# Patient Record
Sex: Female | Born: 1985 | Hispanic: No | Marital: Single | Smoking: Never smoker
Health system: Southern US, Community
[De-identification: ages and names within clinical notes are randomized; demographics above are authoritative.]

## PROBLEM LIST (undated history)

## (undated) DIAGNOSIS — N912 Amenorrhea, unspecified: Secondary | ICD-10-CM

## (undated) DIAGNOSIS — N879 Dysplasia of cervix uteri, unspecified: Secondary | ICD-10-CM

## (undated) DIAGNOSIS — N809 Endometriosis, unspecified: Secondary | ICD-10-CM

## (undated) DIAGNOSIS — A6000 Herpesviral infection of urogenital system, unspecified: Secondary | ICD-10-CM

## (undated) DIAGNOSIS — R87619 Unspecified abnormal cytological findings in specimens from cervix uteri: Secondary | ICD-10-CM

## (undated) HISTORY — PX: CERVICAL BIOPSY  W/ LOOP ELECTRODE EXCISION: SUR135

## (undated) HISTORY — DX: Dysplasia of cervix uteri, unspecified: N87.9

## (undated) HISTORY — DX: Endometriosis, unspecified: N80.9

## (undated) HISTORY — PX: LEEP: SHX91

## (undated) HISTORY — DX: Herpesviral infection of urogenital system, unspecified: A60.00

## (undated) HISTORY — DX: Amenorrhea, unspecified: N91.2

## (undated) HISTORY — DX: Unspecified abnormal cytological findings in specimens from cervix uteri: R87.619

---
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
## 2007-01-04 ENCOUNTER — Inpatient Hospital Stay: Payer: Self-pay | Admitting: Internal Medicine

## 2007-06-01 ENCOUNTER — Emergency Department: Payer: Self-pay | Admitting: Emergency Medicine

## 2007-09-08 ENCOUNTER — Observation Stay: Payer: Self-pay | Admitting: Obstetrics and Gynecology

## 2007-09-28 ENCOUNTER — Observation Stay: Payer: Self-pay | Admitting: Obstetrics and Gynecology

## 2007-09-30 ENCOUNTER — Observation Stay: Payer: Self-pay

## 2007-10-02 ENCOUNTER — Observation Stay: Payer: Self-pay | Admitting: Certified Nurse Midwife

## 2007-10-05 ENCOUNTER — Inpatient Hospital Stay: Payer: Self-pay

## 2008-11-18 ENCOUNTER — Emergency Department: Payer: Self-pay | Admitting: Emergency Medicine

## 2008-11-23 ENCOUNTER — Emergency Department: Payer: Self-pay | Admitting: Emergency Medicine

## 2008-11-27 ENCOUNTER — Emergency Department: Payer: Self-pay | Admitting: Unknown Physician Specialty

## 2010-04-22 ENCOUNTER — Emergency Department: Payer: Self-pay | Admitting: Unknown Physician Specialty

## 2010-06-12 ENCOUNTER — Emergency Department: Payer: Self-pay | Admitting: Emergency Medicine

## 2010-10-25 ENCOUNTER — Emergency Department: Payer: Self-pay | Admitting: Emergency Medicine

## 2010-11-06 ENCOUNTER — Emergency Department: Payer: Self-pay | Admitting: Internal Medicine

## 2012-09-14 ENCOUNTER — Emergency Department: Payer: Self-pay | Admitting: Emergency Medicine

## 2012-09-14 LAB — MONONUCLEOSIS SCREEN: Mono Test: NEGATIVE

## 2012-09-16 LAB — BETA STREP CULTURE(ARMC)

## 2014-02-06 ENCOUNTER — Emergency Department: Payer: Self-pay | Admitting: Emergency Medicine

## 2014-02-06 LAB — BASIC METABOLIC PANEL
Anion Gap: 6 — ABNORMAL LOW (ref 7–16)
BUN: 14 mg/dL (ref 7–18)
CALCIUM: 8.5 mg/dL (ref 8.5–10.1)
CHLORIDE: 104 mmol/L (ref 98–107)
Co2: 27 mmol/L (ref 21–32)
Creatinine: 0.9 mg/dL (ref 0.60–1.30)
EGFR (Non-African Amer.): >60
GLUCOSE: 95 mg/dL (ref 65–99)
Osmolality: 274 (ref 275–301)
Potassium: 3.8 mmol/L (ref 3.5–5.1)
SODIUM: 137 mmol/L (ref 136–145)

## 2014-02-06 LAB — URINALYSIS, COMPLETE
Bacteria: NONE SEEN
Bilirubin,UR: NEGATIVE
Blood: NEGATIVE
Glucose,UR: NEGATIVE mg/dL (ref 0–75)
KETONE: NEGATIVE
Leukocyte Esterase: NEGATIVE
Nitrite: NEGATIVE
Ph: 5 (ref 4.5–8.0)
Protein: NEGATIVE
RBC,UR: 1 /HPF (ref 0–5)
SPECIFIC GRAVITY: 1.031 (ref 1.003–1.030)
Squamous Epithelial: 4
WBC UR: 1 /HPF (ref 0–5)

## 2014-02-06 LAB — CBC WITH DIFFERENTIAL/PLATELET
BASOS ABS: 0 10*3/uL (ref 0.0–0.1)
Basophil %: 0.3 %
EOS ABS: 0 10*3/uL (ref 0.0–0.7)
Eosinophil %: 0.5 %
HCT: 41.7 % (ref 35.0–47.0)
HGB: 13.5 g/dL (ref 12.0–16.0)
LYMPHS ABS: 1.8 10*3/uL (ref 1.0–3.6)
Lymphocyte %: 22.6 %
MCH: 27.6 pg (ref 26.0–34.0)
MCHC: 32.3 g/dL (ref 32.0–36.0)
MCV: 85 fL (ref 80–100)
Monocyte #: 0.5 x10 3/mm (ref 0.2–0.9)
Monocyte %: 6.3 %
NEUTROS ABS: 5.6 10*3/uL (ref 1.4–6.5)
Neutrophil %: 70.3 %
Platelet: 224 10*3/uL (ref 150–440)
RBC: 4.88 10*6/uL (ref 3.80–5.20)
RDW: 13.4 % (ref 11.5–14.5)
WBC: 7.9 10*3/uL (ref 3.6–11.0)

## 2014-04-18 ENCOUNTER — Emergency Department: Payer: Self-pay | Admitting: Emergency Medicine

## 2014-04-18 LAB — BASIC METABOLIC PANEL
ANION GAP: 7 (ref 7–16)
BUN: 9 mg/dL (ref 7–18)
CALCIUM: 8.7 mg/dL (ref 8.5–10.1)
Chloride: 104 mmol/L (ref 98–107)
Co2: 28 mmol/L (ref 21–32)
Creatinine: 0.81 mg/dL (ref 0.60–1.30)
Glucose: 87 mg/dL (ref 65–99)
OSMOLALITY: 276 (ref 275–301)
Potassium: 3.6 mmol/L (ref 3.5–5.1)
SODIUM: 139 mmol/L (ref 136–145)

## 2014-04-18 LAB — CBC
HCT: 40.1 % (ref 35.0–47.0)
HGB: 13.1 g/dL (ref 12.0–16.0)
MCH: 28.2 pg (ref 26.0–34.0)
MCHC: 32.6 g/dL (ref 32.0–36.0)
MCV: 87 fL (ref 80–100)
Platelet: 233 10*3/uL (ref 150–440)
RBC: 4.63 10*6/uL (ref 3.80–5.20)
RDW: 12.8 % (ref 11.5–14.5)
WBC: 8.3 10*3/uL (ref 3.6–11.0)

## 2014-04-18 LAB — URINALYSIS, COMPLETE
Bacteria: NONE SEEN
Bilirubin,UR: NEGATIVE
Blood: NEGATIVE
GLUCOSE, UR: NEGATIVE mg/dL (ref 0–75)
KETONE: NEGATIVE
Leukocyte Esterase: NEGATIVE
Nitrite: NEGATIVE
PROTEIN: NEGATIVE
Ph: 6 (ref 4.5–8.0)
RBC,UR: 1 /HPF (ref 0–5)
SPECIFIC GRAVITY: 1.011 (ref 1.003–1.030)
WBC UR: 1 /HPF (ref 0–5)

## 2014-04-18 LAB — HCG, QUANTITATIVE, PREGNANCY: Beta Hcg, Quant.: 7877 m[IU]/mL — ABNORMAL HIGH

## 2015-04-01 ENCOUNTER — Encounter: Payer: Self-pay | Admitting: Urgent Care

## 2015-04-01 ENCOUNTER — Emergency Department
Admission: EM | Admit: 2015-04-01 | Discharge: 2015-04-01 | Disposition: A | Discharge status: Home / Self Care | Payer: BLUE CROSS/BLUE SHIELD | Attending: Emergency Medicine | Admitting: Emergency Medicine

## 2015-04-01 DIAGNOSIS — R1032 Left lower quadrant pain: Secondary | ICD-10-CM | POA: Diagnosis not present

## 2015-04-01 DIAGNOSIS — Z3202 Encounter for pregnancy test, result negative: Secondary | ICD-10-CM | POA: Diagnosis not present

## 2015-04-01 DIAGNOSIS — R197 Diarrhea, unspecified: Secondary | ICD-10-CM | POA: Insufficient documentation

## 2015-04-01 DIAGNOSIS — R110 Nausea: Secondary | ICD-10-CM | POA: Diagnosis not present

## 2015-04-01 DIAGNOSIS — R109 Unspecified abdominal pain: Secondary | ICD-10-CM | POA: Diagnosis present

## 2015-04-01 DIAGNOSIS — R140 Abdominal distension (gaseous): Secondary | ICD-10-CM | POA: Insufficient documentation

## 2015-04-01 DIAGNOSIS — R1084 Generalized abdominal pain: Secondary | ICD-10-CM | POA: Diagnosis not present

## 2015-04-01 LAB — CBC
HEMATOCRIT: 41.1 % (ref 35.0–47.0)
Hemoglobin: 13.2 g/dL (ref 12.0–16.0)
MCH: 26.5 pg (ref 26.0–34.0)
MCHC: 32 g/dL (ref 32.0–36.0)
MCV: 82.7 fL (ref 80.0–100.0)
PLATELETS: 230 10*3/uL (ref 150–440)
RBC: 4.97 MIL/uL (ref 3.80–5.20)
RDW: 13.4 % (ref 11.5–14.5)
WBC: 8.3 10*3/uL (ref 3.6–11.0)

## 2015-04-01 LAB — COMPREHENSIVE METABOLIC PANEL
ALT: 19 U/L (ref 14–54)
AST: 21 U/L (ref 15–41)
Albumin: 5.1 g/dL — ABNORMAL HIGH (ref 3.5–5.0)
Alkaline Phosphatase: 45 U/L (ref 38–126)
Anion gap: 8 (ref 5–15)
BUN: 15 mg/dL (ref 6–20)
CHLORIDE: 106 mmol/L (ref 101–111)
CO2: 24 mmol/L (ref 22–32)
CREATININE: 0.82 mg/dL (ref 0.44–1.00)
Calcium: 9.7 mg/dL (ref 8.9–10.3)
Glucose, Bld: 117 mg/dL — ABNORMAL HIGH (ref 65–99)
POTASSIUM: 3.4 mmol/L — AB (ref 3.5–5.1)
SODIUM: 138 mmol/L (ref 135–145)
Total Bilirubin: 0.4 mg/dL (ref 0.3–1.2)
Total Protein: 8.5 g/dL — ABNORMAL HIGH (ref 6.5–8.1)

## 2015-04-01 LAB — URINALYSIS COMPLETE WITH MICROSCOPIC (ARMC ONLY)
BACTERIA UA: NONE SEEN
Bilirubin Urine: NEGATIVE
Glucose, UA: NEGATIVE mg/dL
HGB URINE DIPSTICK: NEGATIVE
LEUKOCYTES UA: NEGATIVE
Nitrite: NEGATIVE
PH: 5 (ref 5.0–8.0)
PROTEIN: NEGATIVE mg/dL
SPECIFIC GRAVITY, URINE: 1.029 (ref 1.005–1.030)

## 2015-04-01 LAB — POCT PREGNANCY, URINE: PREG TEST UR: NEGATIVE

## 2015-04-01 LAB — LIPASE, BLOOD: LIPASE: 42 U/L (ref 11–51)

## 2015-04-01 MED ORDER — GI COCKTAIL ~~LOC~~
30.0000 mL | Freq: Once | ORAL | Status: AC
  Administered 2015-04-01: 30 mL via ORAL
  Filled 2015-04-01: qty 30

## 2015-04-01 MED ORDER — DICYCLOMINE HCL 10 MG PO CAPS: 10.0000 mg | ORAL_CAPSULE | Freq: Three times a day (TID) | ORAL | Status: DC

## 2015-04-01 MED ORDER — CIPROFLOXACIN HCL 500 MG PO TABS: 500.0000 mg | ORAL_TABLET | Freq: Two times a day (BID) | ORAL | Status: AC

## 2015-04-01 NOTE — ED Notes (Signed)
MD at bedside. 

## 2015-04-01 NOTE — ED Provider Notes (Signed)
[facility] Emergency Department Provider Note  ____________________________________________  Time seen: Approximately 413 AM  I have reviewed the triage vital signs and the nursing notes.   HISTORY  Chief Complaint Abdominal Pain    HPI Genice RougeMegan E Dicenzo is a 29 y.o. female who comes into the hospital today with sharp abdominal pain and nausea. The patient reports that she feels as though she needs to vomit. She reports that the pain occurs out of nowhere and goes away on its own. She reports that the symptoms started last week. She reports that she took some aspirin and within 45 minutes the symptoms came back. Currently the patient has eased off and it is a 3 out of 10 in intensity. The patient feels that her stomach is bloated and her entire stomach hurts. She is also been having some diarrhea this been frequent small quantities. The patient has not been having any fever denies any sick contacts. She reports that she just feels really crampy and uncomfortable. She was unsure what to do is going on so she decided to come into the hospital for evaluation.   History reviewed. No pertinent past medical history.  There are no active problems to display for this patient.   Past Surgical History  Procedure Laterality Date  . Leep      Current Outpatient Rx  Name  Route  Sig  Dispense  Refill  . ciprofloxacin (CIPRO) 500 MG tablet   Oral   Take 1 tablet (500 mg total) by mouth 2 (two) times daily.   10 tablet   0   . dicyclomine (BENTYL) 10 MG capsule   Oral   Take 1 capsule (10 mg total) by mouth 3 (three) times daily before meals.   15 capsule   0     Allergies Review of patient's allergies indicates no known allergies.  No family history on file.  Social History Social History  Substance Use Topics  . Smoking status: Never Smoker   . Smokeless tobacco: None  . Alcohol Use: Yes    Review of Systems Constitutional: No fever/chills Eyes: No  visual changes. ENT: No sore throat. Cardiovascular: Denies chest pain. Respiratory: Denies shortness of breath. Gastrointestinal:  abdominal pain.  Nausea and diarrhea Genitourinary: Negative for dysuria. Musculoskeletal: Negative for back pain. Skin: Negative for rash. Neurological: Negative for headaches, focal weakness or numbness.  10-point ROS otherwise negative.  ____________________________________________   PHYSICAL EXAM:  VITAL SIGNS: ED Triage Vitals  Enc Vitals Group     BP 04/01/15 0040 134/89 mmHg     Pulse Rate 04/01/15 0040 97     Resp 04/01/15 0040 16     Temp 04/01/15 0040 98.4 F (36.9 C)     Temp Source 04/01/15 0040 Oral     SpO2 04/01/15 0040 100 %     Weight 04/01/15 0040 170 lb (77.111 kg)     Height 04/01/15 0040 5\' 11"  (1.803 m)     Head Cir --      Peak Flow --      Pain Score 04/01/15 0040 7     Pain Loc --      Pain Edu? --      Excl. in GC? --     Constitutional: Alert and oriented. Well appearing and in mild distress. Eyes: Conjunctivae are normal. PERRL. EOMI. Head: Atraumatic. Nose: No congestion/rhinnorhea. Mouth/Throat: Mucous membranes are moist.  Oropharynx non-erythematous. Cardiovascular: Normal rate, regular rhythm. Grossly normal heart sounds.  Good peripheral  circulation. Respiratory: Normal respiratory effort.  No retractions. Lungs CTAB. Gastrointestinal: Soft with mild diffuse tenderness to palpation worse in the left lower quadrant. No distention. Increased bowel sounds Musculoskeletal: No lower extremity tenderness nor edema.   Neurologic:  Normal speech and language.  Skin:  Skin is warm, dry and intact.  Psychiatric: Mood and affect are normal.   ____________________________________________   LABS (all labs ordered are listed, but only abnormal results are displayed)  Labs Reviewed  COMPREHENSIVE METABOLIC PANEL - Abnormal; Notable for the following:    Potassium 3.4 (*)    Glucose, Bld 117 (*)    Total  Protein 8.5 (*)    Albumin 5.1 (*)    All other components within normal limits  URINALYSIS COMPLETEWITH MICROSCOPIC ([facility] ONLY) - Abnormal; Notable for the following:    Color, Urine YELLOW (*)    APPearance HAZY (*)    Ketones, ur TRACE (*)    Squamous Epithelial / LPF 6-30 (*)    All other components within normal limits  LIPASE, BLOOD  CBC  POC URINE PREG, ED  POCT PREGNANCY, URINE   ____________________________________________  EKG  None ____________________________________________  RADIOLOGY  None ____________________________________________   PROCEDURES  Procedure(s) performed: None  Critical Care performed: No  ____________________________________________   INITIAL IMPRESSION / ASSESSMENT AND PLAN / ED COURSE  Pertinent labs & imaging results that were available during my care of the patient were reviewed by me and considered in my medical decision making (see chart for details).  This is a 29 year old female who comes into the hospital today with diffuse abdominal pain that comes and goes. She reports that she's been having diarrhea and the pain seems to be worse in her left lower quadrant. Patient has been having diarrhea and has some increased hyperactive bowel sounds. I did give the patient and GI cocktail initially but she reports that that made her pain little bit worse. I feel that the patient is having some cramping from hyperactive bowels and could use an antispasmodic such as Bentyl which will help her symptoms. Also given the diarrhea I feel the patient can be placed on a short course of ciprofloxacin which may help her symptoms as well. I discussed this with the patient and informed her that she should also follow up with the acute care clinic to determine if the medications are helping her symptoms. The patient does not have an abnormal white blood cell count nor does she have any abnormal electrolytes. The patient be discharged home to follow-up. The  patient understands and agrees with the plan as stated. ____________________________________________   FINAL CLINICAL IMPRESSION(S) / ED DIAGNOSES  Final diagnoses:  Diarrhea of presumed infectious origin  Generalized abdominal pain      Rebecka Apley, MD 04/01/15 [phone]

## 2015-04-01 NOTE — ED Notes (Signed)
Patient presents with c/o acute onset diffuse abd pain that began ONE hour PTA. Bloating sensation reported - reports frequent BC powder use. (+) nausea reported. Denies urinary symptoms.

## 2015-04-01 NOTE — Discharge Instructions (Signed)
Abdominal Pain, Adult Many things can cause abdominal pain. Usually, abdominal pain is not caused by a disease and will improve without treatment. It can often be observed and treated at home. Your health care provider will do a physical exam and possibly order blood tests and X-rays to help determine the seriousness of your pain. However, in many cases, more time must pass before a clear cause of the pain can be found. Before that point, your health care provider may not know if you need more testing or further treatment. HOME CARE INSTRUCTIONS Monitor your abdominal pain for any changes. The following actions may help to alleviate any discomfort you are experiencing:  Only take over-the-counter or prescription medicines as directed by your health care provider.  Do not take laxatives unless directed to do so by your health care provider.  Try a clear liquid diet (broth, tea, or water) as directed by your health care provider. Slowly move to a bland diet as tolerated. SEEK MEDICAL CARE IF:  You have unexplained abdominal pain.  You have abdominal pain associated with nausea or diarrhea.  You have pain when you urinate or have a bowel movement.  You experience abdominal pain that wakes you in the night.  You have abdominal pain that is worsened or improved by eating food.  You have abdominal pain that is worsened with eating fatty foods.  You have a fever. SEEK IMMEDIATE MEDICAL CARE IF:  Your pain does not go away within 2 hours.  You keep throwing up (vomiting).  Your pain is felt only in portions of the abdomen, such as the right side or the left lower portion of the abdomen.  You pass bloody or black tarry stools. MAKE SURE YOU:  Understand these instructions.  Will watch your condition.  Will get help right away if you are not doing well or get worse.   This information is not intended to replace advice given to you by your health care provider. Make sure you discuss  any questions you have with your health care provider.   Document Released: 01/06/2005 Document Revised: 12/18/2014 Document Reviewed: 12/06/2012 Elsevier Interactive Patient Education 2016 [place].  Diarrhea Diarrhea is frequent loose and watery bowel movements. It can cause you to feel weak and dehydrated. Dehydration can cause you to become tired and thirsty, have a dry mouth, and have decreased urination that often is dark yellow. Diarrhea is a sign of another problem, most often an infection that will not last long. In most cases, diarrhea typically lasts 2-3 days. However, it can last longer if it is a sign of something more serious. It is important to treat your diarrhea as directed by your caregiver to lessen or prevent future episodes of diarrhea. CAUSES  Some common causes include:  Gastrointestinal infections caused by viruses, bacteria, or parasites.  Food poisoning or food allergies.  Certain medicines, such as antibiotics, chemotherapy, and laxatives.  Artificial sweeteners and fructose.  Digestive disorders. HOME CARE INSTRUCTIONS  Ensure adequate fluid intake (hydration): Have 1 cup (8 oz) of fluid for each diarrhea episode. Avoid fluids that contain simple sugars or sports drinks, fruit juices, whole milk products, and sodas. Your urine should be clear or pale yellow if you are drinking enough fluids. Hydrate with an oral rehydration solution that you can purchase at pharmacies, retail stores, and online. You can prepare an oral rehydration solution at home by mixing the following ingredients together:   - tsp table salt.   tsp baking soda.  tsp salt substitute containing potassium chloride.  1  tablespoons sugar.  1 L (34 oz) of water.  Certain foods and beverages may increase the speed at which food moves through the gastrointestinal (GI) tract. These foods and beverages should be avoided and include:  Caffeinated and alcoholic beverages.  High-fiber  foods, such as raw fruits and vegetables, nuts, seeds, and whole grain breads and cereals.  Foods and beverages sweetened with sugar alcohols, such as xylitol, sorbitol, and mannitol.  Some foods may be well tolerated and may help thicken stool including:  Starchy foods, such as rice, toast, pasta, low-sugar cereal, oatmeal, grits, baked potatoes, crackers, and bagels.  Bananas.  Applesauce.  Add probiotic-rich foods to help increase healthy bacteria in the GI tract, such as yogurt and fermented milk products.  Wash your hands well after each diarrhea episode.  Only take over-the-counter or prescription medicines as directed by your caregiver.  Take a warm bath to relieve any burning or pain from frequent diarrhea episodes. SEEK IMMEDIATE MEDICAL CARE IF:   You are unable to keep fluids down.  You have persistent vomiting.  You have blood in your stool, or your stools are black and tarry.  You do not urinate in 6-8 hours, or there is only a small amount of very dark urine.  You have abdominal pain that increases or localizes.  You have weakness, dizziness, confusion, or light-headedness.  You have a severe headache.  Your diarrhea gets worse or does not get better.  You have a fever or persistent symptoms for more than 2-3 days.  You have a fever and your symptoms suddenly get worse. MAKE SURE YOU:   Understand these instructions.  Will watch your condition.  Will get help right away if you are not doing well or get worse.   This information is not intended to replace advice given to you by your health care provider. Make sure you discuss any questions you have with your health care provider.   Document Released: 03/19/2002 Document Revised: 04/19/2014 Document Reviewed: 12/05/2011 Elsevier Interactive Patient Education 2016 Elsevier Inc.  Colitis Colitis is inflammation of the colon. Colitis may last a short time (acute) or it may last a long time  (chronic). CAUSES This condition may be caused by:  Viruses.  Bacteria.  Reactions to medicine.  Certain autoimmune diseases, such as Crohn disease or ulcerative colitis. SYMPTOMS Symptoms of this condition include:  Diarrhea.  Passing bloody or tarry stool.  Pain.  Fever.  Vomiting.  Tiredness (fatigue).  Weight loss.  Bloating.  Sudden increase in abdominal pain.  Having fewer bowel movements than usual. DIAGNOSIS This condition is diagnosed with a stool test or a blood test. You may also have other tests, including X-rays, a CT scan, or a colonoscopy. TREATMENT Treatment may include:  Resting the bowel. This involves not eating or drinking for a period of time.  Fluids that are given through an IV tube.  Medicine for pain and diarrhea.  Antibiotic medicines.  Cortisone medicines.  Surgery. HOME CARE INSTRUCTIONS Eating and Drinking  Follow instructions from your health care provider about eating or drinking restrictions.  Drink enough fluid to keep your urine clear or pale yellow.  Work with a dietitian to determine which foods cause your condition to flare up.  Avoid foods that cause flare-ups.  Eat a well-balanced diet. Medicines  Take over-the-counter and prescription medicines only as told by your health care provider.  If you were prescribed an antibiotic medicine, take it  as told by your health care provider. Do not stop taking the antibiotic even if you start to feel better. General Instructions  Keep all follow-up visits as told by your health care provider. This is important. SEEK MEDICAL CARE IF:  Your symptoms do not go away.  You develop new symptoms. SEEK IMMEDIATE MEDICAL CARE IF:  You have a fever that does not go away with treatment.  You develop chills.  You have extreme weakness, fainting, or dehydration.  You have repeated vomiting.  You develop severe pain in your abdomen.  You pass bloody or tarry stool.    This information is not intended to replace advice given to you by your health care provider. Make sure you discuss any questions you have with your health care provider.   Document Released: 05/06/2004 Document Revised: 12/18/2014 Document Reviewed: 07/22/2014 Elsevier Interactive Patient Education [employer] Elsevier Inc.

## 2016-01-21 ENCOUNTER — Telehealth: Payer: Self-pay | Admitting: Obstetrics and Gynecology

## 2016-01-21 NOTE — Telephone Encounter (Signed)
lmtrc

## 2016-01-21 NOTE — Telephone Encounter (Signed)
Pt is almost [redacted] wk pregnant confirmed at [facility], I schedule her nurse ob intake in 2 wks but she is having pain on her left side, not constant and it's not sharpe, it comes and goes.  The nurse at [facility] told her to go to hospital but she wanted to see what we thought? Can you call her.

## 2016-01-21 NOTE — Telephone Encounter (Signed)
Message that pt had returned call. I spoke with pt and she states that pain of left side is more like a dull ache, not all the time but was just concerned. LMP: 12/12/2015 exact. EGA 5.5 wks. EDD: 09/17/2016. Pt advised that it could possibly be an ovarian cyst of which is not uncommon in pregnancy. Pt also states that her stomach feels "puggy". No pain but feels this may be a little early for that. Has had previous pregnancy of which may have stretch her muscles there.  Pt to keep a check on pain and contact office or go to ER if pain severe and persistent. Otherwise take Tylenol ES and rest as much as possible. (pt is on feet all day at work).

## 2016-01-21 NOTE — Telephone Encounter (Signed)
Pt called and stated that she was returning your call

## 2016-02-02 ENCOUNTER — Encounter: Payer: Self-pay | Admitting: Emergency Medicine

## 2016-02-02 ENCOUNTER — Emergency Department
Admission: EM | Admit: 2016-02-02 | Discharge: 2016-02-02 | Disposition: A | Discharge status: Home / Self Care | Payer: Medicaid Other | Attending: Emergency Medicine | Admitting: Emergency Medicine

## 2016-02-02 ENCOUNTER — Emergency Department: Payer: Medicaid Other

## 2016-02-02 ENCOUNTER — Telehealth: Payer: Self-pay

## 2016-02-02 DIAGNOSIS — R102 Pelvic and perineal pain: Secondary | ICD-10-CM | POA: Insufficient documentation

## 2016-02-02 DIAGNOSIS — O99351 Diseases of the nervous system complicating pregnancy, first trimester: Secondary | ICD-10-CM | POA: Insufficient documentation

## 2016-02-02 DIAGNOSIS — R55 Syncope and collapse: Secondary | ICD-10-CM

## 2016-02-02 DIAGNOSIS — N760 Acute vaginitis: Secondary | ICD-10-CM

## 2016-02-02 DIAGNOSIS — Z79899 Other long term (current) drug therapy: Secondary | ICD-10-CM | POA: Insufficient documentation

## 2016-02-02 DIAGNOSIS — O23591 Infection of other part of genital tract in pregnancy, first trimester: Secondary | ICD-10-CM | POA: Insufficient documentation

## 2016-02-02 DIAGNOSIS — B9689 Other specified bacterial agents as the cause of diseases classified elsewhere: Secondary | ICD-10-CM

## 2016-02-02 DIAGNOSIS — Z3A01 Less than 8 weeks gestation of pregnancy: Secondary | ICD-10-CM | POA: Insufficient documentation

## 2016-02-02 LAB — ABO/RH: ABO/RH(D): B POS

## 2016-02-02 LAB — CHLAMYDIA/NGC RT PCR (ARMC ONLY)
CHLAMYDIA TR: NOT DETECTED
N GONORRHOEAE: NOT DETECTED

## 2016-02-02 LAB — WET PREP, GENITAL
TRICH WET PREP: NONE SEEN
YEAST WET PREP: NONE SEEN

## 2016-02-02 LAB — URINALYSIS COMPLETE WITH MICROSCOPIC (ARMC ONLY)
Bilirubin Urine: NEGATIVE
GLUCOSE, UA: NEGATIVE mg/dL
HGB URINE DIPSTICK: NEGATIVE
KETONES UR: NEGATIVE mg/dL
LEUKOCYTES UA: NEGATIVE
NITRITE: NEGATIVE
Protein, ur: NEGATIVE mg/dL
Specific Gravity, Urine: 1.015 (ref 1.005–1.030)
pH: 5 (ref 5.0–8.0)

## 2016-02-02 LAB — HCG, QUANTITATIVE, PREGNANCY: HCG, BETA CHAIN, QUANT, S: 83288 m[IU]/mL — AB (ref <5)

## 2016-02-02 LAB — BASIC METABOLIC PANEL
ANION GAP: 8 (ref 5–15)
BUN: 8 mg/dL (ref 6–20)
CALCIUM: 9.3 mg/dL (ref 8.9–10.3)
CO2: 24 mmol/L (ref 22–32)
Chloride: 102 mmol/L (ref 101–111)
Creatinine, Ser: 0.67 mg/dL (ref 0.44–1.00)
GFR calc Af Amer: >60 mL/min (ref >60)
Glucose, Bld: 92 mg/dL (ref 65–99)
POTASSIUM: 3.7 mmol/L (ref 3.5–5.1)
SODIUM: 134 mmol/L — AB (ref 135–145)

## 2016-02-02 LAB — CBC
HCT: 37.9 % (ref 35.0–47.0)
HEMOGLOBIN: 12.9 g/dL (ref 12.0–16.0)
MCH: 28.3 pg (ref 26.0–34.0)
MCHC: 34.2 g/dL (ref 32.0–36.0)
MCV: 82.8 fL (ref 80.0–100.0)
Platelets: 212 10*3/uL (ref 150–440)
RBC: 4.58 MIL/uL (ref 3.80–5.20)
RDW: 13.2 % (ref 11.5–14.5)
WBC: 7.6 10*3/uL (ref 3.6–11.0)

## 2016-02-02 MED ORDER — METRONIDAZOLE 500 MG PO TABS: 500.0000 mg | ORAL_TABLET | Freq: Two times a day (BID) | ORAL | 0 refills | Status: DC

## 2016-02-02 MED ORDER — SODIUM CHLORIDE 0.9 % IV BOLUS (SEPSIS)
1000.0000 mL | Freq: Once | INTRAVENOUS | Status: AC
  Administered 2016-02-02: 1000 mL via INTRAVENOUS

## 2016-02-02 NOTE — Telephone Encounter (Signed)
Pt states this am she was feeling dizzy, lite headed, sweaty, and hrt racing. She sat down and had some h20 and felt better. NO n/v/d. NO h/a. NO vb or pp. Slight discomfort at times in pelvic area but relived with tylenol. Pt advised to push fluids. At least 4  20oz bottles a day. May want to drink Gatorade as well. Eat every couple of hours. Protein, fruits and veggies. If sx still persist, she will need to make an appt to be seen. Pt voices understanding.

## 2016-02-02 NOTE — ED Notes (Addendum)
Pt stating that while she was at work she became really dizzy and then sat down and broke out in a sweat. Pt stating that she is [redacted] weeks pregnant. Pt had nausea with this episode. Pt denying any vaginal bleeding.

## 2016-02-02 NOTE — ED Provider Notes (Signed)
[facility]-EMERGENCY DEPARTMENT Provider Note   CSN: [id number] Arrival date & time: 02/02/16  1026     History   Chief Complaint Chief Complaint  Patient presents with  . Near Syncope    HPI Angela Fox is a 30 y.o. female here with vaginal bleeding. LMP was beginning of August. She has been Nauseated for several weeks. She had a positive pregnancy [facility] several weeks ago. This morning, she had sudden onset of left lower quadrant pain and almost passed out. She did not have an ultrasound for this baby yet. Denies any vaginal bleeding. States that she had previous pregnancy but did not receive RhoGAM before.   The history is provided by the patient.    History reviewed. No pertinent past medical history.  There are no active problems to display for this patient.   Past Surgical History:  Procedure Laterality Date  . LEEP      OB History    Gravida Para Term Preterm AB Living   1             SAB TAB Ectopic Multiple Live Births                   Home Medications    Prior to Admission medications   Medication Sig Start Date End Date Taking? Authorizing Provider  dicyclomine (BENTYL) 10 MG capsule Take 1 capsule (10 mg total) by mouth 3 (three) times daily before meals. 04/01/15 04/15/15  Rebecka Apley, MD    Family History No family history on file.  Social History Social History  Substance Use Topics  . Smoking status: Never Smoker  . Smokeless tobacco: Never Used  . Alcohol use Yes     Allergies   Review of patient's allergies indicates no known allergies.   Review of Systems Review of Systems  Cardiovascular: Positive for near-syncope.  Gastrointestinal: Positive for abdominal pain.  Neurological: Positive for dizziness.  All other systems reviewed and are negative.    Physical Exam Updated Vital Signs BP 108/77 (BP Location: Right Arm)   Pulse 92   Temp 98.5 F (36.9 C) (Oral)   Resp 16   Ht 5\' 11"  (1.803 m)    Wt 160 lb (72.6 kg)   LMP 11/11/2015   SpO2 100%   BMI 22.32 kg/m   Physical Exam  Constitutional: She is oriented to person, place, and time.  Slightly dehydrated   HENT:  Head: Normocephalic.  MM slightly dry   Eyes: EOM are normal. Pupils are equal, round, and reactive to light.  Neck: Normal range of motion. Neck supple.  Cardiovascular: Normal rate, regular rhythm and normal heart sounds.   Pulmonary/Chest: Effort normal and breath sounds normal. No respiratory distress. She has no wheezes. She has no rales.  Abdominal: Soft. Bowel sounds are normal.  Mild L pelvic tenderness   Genitourinary:  Genitourinary Comments: Whitish, clear discharge. + l adnexal tenderness. Os closed   Musculoskeletal: Normal range of motion.  Neurological: She is alert and oriented to person, place, and time.  Skin: Skin is warm.  Psychiatric: She has a normal mood and affect.  Nursing note and vitals reviewed.    ED Treatments / Results  Labs (all labs ordered are listed, but only abnormal results are displayed) Labs Reviewed  WET PREP, GENITAL - Abnormal; Notable for the following:       Result Value   Clue Cells Wet Prep HPF POC PRESENT (*)    WBC,  Wet Prep HPF POC FEW (*)    All other components within normal limits  BASIC METABOLIC PANEL - Abnormal; Notable for the following:    Sodium 134 (*)    All other components within normal limits  URINALYSIS COMPLETEWITH MICROSCOPIC ([facility] ONLY) - Abnormal; Notable for the following:    Color, Urine YELLOW (*)    APPearance CLEAR (*)    Bacteria, UA RARE (*)    Squamous Epithelial / LPF 0-5 (*)    All other components within normal limits  HCG, QUANTITATIVE, PREGNANCY - Abnormal; Notable for the following:    hCG, Beta Chain, Quant, S 83,288 (*)    All other components within normal limits  CHLAMYDIA/NGC RT PCR ([facility] ONLY)  CBC  CBG MONITORING, ED  ABO/RH    EKG  EKG Interpretation None      ED ECG REPORT I, Richardean Canalavid H  Ulisses Vondrak, the attending physician, personally viewed and interpreted this ECG.   Date: 02/02/2016  EKG Time: 11:16 am  Rate: 75  Rhythm: normal EKG, normal sinus rhythm  Axis: normal  Intervals:none  ST&T Change: none   Radiology No results found.  Procedures Procedures (including critical care time)  Medications Ordered in ED Medications  sodium chloride 0.9 % bolus 1,000 mL (1,000 mLs Intravenous New Bag/Given 02/02/16 1414)     Initial Impression / Assessment and Plan / ED Course  I have reviewed the triage vital signs and the nursing notes.  Pertinent labs & imaging results that were available during my care of the patient were reviewed by me and considered in my medical decision making (see chart for details).  Clinical Course    Angela Fox is a 30 y.o. female here with LLQ pain, near syncope. Concerned for possible ectopic. Will get labs, HCG, Rh, Transvag US.   2:38 PM Patient orthostatic by HR criteria. HCG 83,000. O positive. US pending.   3:30 PM US still pending. Signed out to Dr. Cyril LoosenKinner to follow up US. Wet prep + clue cells, given flagyl. Has OB follow up     Final Clinical Impressions(s) / ED Diagnoses   Final diagnoses:  Pelvic pain    New Prescriptions New Prescriptions   No medications on file     Charlynne Panderavid [facility] [place], MD 02/02/16 1530

## 2016-02-02 NOTE — ED Triage Notes (Addendum)
Felt lightheaded and dizzy this morning while at work.  Denies dizziness at this time.  Also patient states she is [redacted] weeks pregnant and has had discomfort to LLQ for several weeks.  Has appointment with scheduled for Friday for first OB visit.

## 2016-02-02 NOTE — ED Notes (Signed)
Patient transported to Ultrasound 

## 2016-02-02 NOTE — ED Notes (Signed)
Lab called for add on 

## 2016-02-09 ENCOUNTER — Ambulatory Visit (INDEPENDENT_AMBULATORY_CARE_PROVIDER_SITE_OTHER): Payer: Medicaid Other | Admitting: Obstetrics and Gynecology

## 2016-02-09 VITALS — BP 104/70 | HR 74 | Wt 162.8 lb

## 2016-02-09 DIAGNOSIS — Z1389 Encounter for screening for other disorder: Secondary | ICD-10-CM

## 2016-02-09 DIAGNOSIS — Z113 Encounter for screening for infections with a predominantly sexual mode of transmission: Secondary | ICD-10-CM

## 2016-02-09 DIAGNOSIS — Z331 Pregnant state, incidental: Secondary | ICD-10-CM

## 2016-02-09 DIAGNOSIS — Z369 Encounter for antenatal screening, unspecified: Secondary | ICD-10-CM

## 2016-02-09 DIAGNOSIS — Z3481 Encounter for supervision of other normal pregnancy, first trimester: Secondary | ICD-10-CM

## 2016-02-09 NOTE — Patient Instructions (Signed)
Pregnancy and Zika Virus Disease Zika virus disease, or Zika, is an illness that can spread to people from mosquitoes that carry the virus. It may also spread from person to person through infected body fluids. Zika first occurred in [place], but recently it has spread to new areas. The virus occurs in tropical climates. The location of Zika continues to change. Most people who become infected with Zika virus do not develop serious illness. However, Zika may cause birth defects in an unborn baby whose mother is infected with the virus. It may also increase the risk of miscarriage. WHAT ARE THE SYMPTOMS OF ZIKA VIRUS DISEASE? In many cases, people who have been infected with Zika virus do not develop any symptoms. If symptoms appear, they usually start about a week after the person is infected. Symptoms are usually mild. They may include:  Fever.  Rash.  Red eyes.  Joint pain. HOW DOES ZIKA VIRUS DISEASE SPREAD? The main way that Zika virus spreads is through the bite of a certain type of mosquito. Unlike most types of mosquitos, which bite only at night, the type of mosquito that carries Zika virus bites both at night and during the day. Zika virus can also spread through sexual contact, through a blood transfusion, and from a mother to her baby before or during birth. Once you have had Zika virus disease, it is unlikely that you will get it again. CAN I PASS ZIKA TO MY BABY DURING PREGNANCY? Yes, Zika can pass from a mother to her baby before or during birth. WHAT PROBLEMS CAN ZIKA CAUSE FOR MY BABY? A woman who is infected with Zika virus while pregnant is at risk of having her baby born with a condition in which the brain or head is smaller than expected (microcephaly). Babies who have microcephaly can have developmental delays, seizures, hearing problems, and vision problems. Having Zika virus disease during pregnancy can also increase the risk of miscarriage. HOW CAN ZIKA VIRUS DISEASE BE  PREVENTED? There is no vaccine to prevent Zika. The best way to prevent the disease is to avoid infected mosquitoes and avoid exposure to body fluids that can spread the virus. Avoid any possible exposure to Zika by taking the following precautions. For women and their sex partners:  Avoid traveling to high-risk areas. The locations where Zika is being reported change often. To identify high-risk areas, check the CDC travel website: www.cdc.gov/zika/geo/index.html  If you or your sex partner must travel to a high-risk area, talk with a health care provider before and after traveling.  Take all precautions to avoid mosquito bites if you live in, or travel to, any of the high-risk areas. Insect repellents are safe to use during pregnancy.  Ask your health care provider when it is safe to have sexual contact. For women:  If you are pregnant or trying to become pregnant, avoid sexual contact with persons who may have been exposed to Zika virus, persons who have possible symptoms of Zika, or persons whose history you are unsure about. If you choose to have sexual contact with someone who may have been exposed to Zika virus, use condoms correctly during the entire duration of sexual activity, every time. Do not share sexual devices, as you may be exposed to body fluids.  Ask your health care provider about when it is safe to attempt pregnancy after a possible exposure to Zika virus. WHAT STEPS SHOULD I TAKE TO AVOID MOSQUITO BITES? Take these steps to avoid mosquito bites when you are   in a high-risk area:  Wear loose clothing that covers your arms and legs.  Limit your outdoor activities.  Do not open windows unless they have window screens.  Sleep under mosquito nets.  Use insect repellent. The best insect repellents have:  DEET, picaridin, oil of lemon eucalyptus (OLE), or IR3535 in them.  Higher amounts of an active ingredient in them.  Remember that insect repellents are safe to use  during pregnancy.  Do not use OLE on children who are younger than 3 years of age. Do not use insect repellent on babies who are younger than 2 months of age.  Cover your child's stroller with mosquito netting. Make sure the netting fits snugly and that any loose netting does not cover your child's mouth or nose. Do not use a blanket as a mosquito-protection cover.  Do not apply insect repellent underneath clothing.  If you are using sunscreen, apply the sunscreen before applying the insect repellent.  Treat clothing with permethrin. Do not apply permethrin directly to your skin. Follow label directions for safe use.  Get rid of standing water, where mosquitoes may reproduce. Standing water is often found in items such as buckets, bowls, animal food dishes, and flowerpots. When you return from traveling to any high-risk area, continue taking actions to protect yourself against mosquito bites for 3 weeks, even if you show no signs of illness. This will prevent spreading Zika virus to uninfected mosquitoes. WHAT SHOULD I KNOW ABOUT THE SEXUAL TRANSMISSION OF ZIKA? People can spread Zika to their sexual partners during vaginal, anal, or oral sex, or by sharing sexual devices. Many people with Zika do not develop symptoms, so a person could spread the disease without knowing that they are infected. The greatest risk is to women who are pregnant or who may become pregnant. Zika virus can live longer in semen than it can live in blood. Couples can prevent sexual transmission of the virus by:  Using condoms correctly during the entire duration of sexual activity, every time. This includes vaginal, anal, and oral sex.  Not sharing sexual devices. Sharing increases your risk of being exposed to body fluid from another person.  Avoiding all sexual activity until your health care provider says it is safe. SHOULD I BE TESTED FOR ZIKA VIRUS? A sample of your blood can be tested for Zika virus. A pregnant  woman should be tested if she may have been exposed to the virus or if she has symptoms of Zika. She may also have additional tests done during her pregnancy, such ultrasound testing. Talk with your health care provider about which tests are recommended.   This information is not intended to replace advice given to you by your health care provider. Make sure you discuss any questions you have with your health care provider.   Document Released: 12/18/2014 Document Reviewed: 12/11/2014 Elsevier Interactive Patient Education 2016 Elsevier Inc. Minor Illnesses and Medications in Pregnancy  Cold/Flu:  Sudafed for congestion- Robitussin (plain) for cough- Tylenol for discomfort.  Please follow the directions on the label.  Try not to take any more than needed.  OTC Saline nasal spray and air humidifier or cool-mist  Vaporizer to sooth nasal irritation and to loosen congestion.  It is also important to increase intake of non carbonated fluids, especially if you have a fever.  Constipation:  Colace-2 capsules at bedtime; Metamucil- follow directions on label; Senokot- 1 tablet at bedtime.  Any one of these medications can be used.  It is also   very important to increase fluids and fruits along with regular exercise.  If problem persists please call the office.  Diarrhea:  Kaopectate as directed on the label.  Eat a bland diet and increase fluids.  Avoid highly seasoned foods.  Headache:  Tylenol 1 or 2 tablets every 3-4 hours as needed  Indigestion:  Maalox, Mylanta, Tums or Rolaids- as directed on label.  Also try to eat small meals and avoid fatty, greasy or spicy foods.  Nausea with or without Vomiting:  Nausea in pregnancy is caused by increased levels of hormones in the body which influence the digestive system and cause irritation when stomach acids accumulate.  Symptoms usually subside after 1st trimester of pregnancy.  Try the following: 1. Keep saltines, graham crackers or dry toast by your bed  to eat upon awakening. 2. Don't let your stomach get empty.  Try to eat 5-6 small meals per day instead of 3 large ones. 3. Avoid greasy fatty or highly seasoned foods.  4. Take OTC Unisom 1 tablet at bed time along with OTC Vitamin B6 25-50 mg 3 times per day.    If nausea continues with vomiting and you are unable to keep down food and fluids you may need a prescription medication.  Please notify your provider.   Sore throat:  Chloraseptic spray, throat lozenges and or plain Tylenol.  Vaginal Yeast Infection:  OTC Monistat for 7 days as directed on label.  If symptoms do not resolve within a week notify provider.  If any of the above problems do not subside with recommended treatment please call the office for further assistance.   Do not take Aspirin, Advil, Motrin or Ibuprofen.  * * OTC= Over the counter Commonly Asked Questions During Pregnancy  Cats: A parasite can be excreted in cat feces.  To avoid exposure you need to have another person empty the little box.  If you must empty the litter box you will need to wear gloves.  Wash your hands after handling your cat.  This parasite can also be found in raw or undercooked meat so this should also be avoided.  Colds, Sore Throats, Flu: Please check your medication sheet to see what you can take for symptoms.  If your symptoms are unrelieved by these medications please call the office.  Dental Work: Most any dental work Agricultural consultantyour dentist recommends is permitted.  X-rays should only be taken during the first trimester if absolutely necessary.  Your abdomen should be shielded with a lead apron during all x-rays.  Please notify your provider prior to receiving any x-rays.  Novocaine is fine; gas is not recommended.  If your dentist requires a note from us prior to dental work please call the office and we will provide one for you.  Exercise: Exercise is an important part of staying healthy during your pregnancy.  You may continue most exercises you  were accustomed to prior to pregnancy.  Later in your pregnancy you will most likely notice you have difficulty with activities requiring balance like riding a bicycle.  It is important that you listen to your body and avoid activities that put you at a higher risk of falling.  Adequate rest and staying well hydrated are a must!  If you have questions about the safety of specific activities ask your provider.    Exposure to Children with illness: Try to avoid obvious exposure; report any symptoms to us when noted,  If you have chicken pos, red measles or mumps, you  should be immune to these diseases.   Please do not take any vaccines while pregnant unless you have checked with your OB provider.  Fetal Movement: After 28 weeks we recommend you do "kick counts" twice daily.  Lie or sit down in a calm quiet environment and count your baby movements "kicks".  You should feel your baby at least 10 times per hour.  If you have not felt 10 kicks within the first hour get up, walk around and have something sweet to eat or drink then repeat for an additional hour.  If count remains less than 10 per hour notify your provider.  Fumigating: Follow your pest control agent's advice as to how long to stay out of your home.  Ventilate the area well before re-entering.  Hemorrhoids:   Most over-the-counter preparations can be used during pregnancy.  Check your medication to see what is safe to use.  It is important to use a stool softener or fiber in your diet and to drink lots of liquids.  If hemorrhoids seem to be getting worse please call the office.   Hot Tubs:  Hot tubs Jacuzzis and saunas are not recommended while pregnant.  These increase your internal body temperature and should be avoided.  Intercourse:  Sexual intercourse is safe during pregnancy as long as you are comfortable, unless otherwise advised by your provider.  Spotting may occur after intercourse; report any bright red bleeding that is heavier than  spotting.  Labor:  If you know that you are in labor, please go to the hospital.  If you are unsure, please call the office and let us help you decide what to do.  Lifting, straining, etc:  If your job requires heavy lifting or straining please check with your provider for any limitations.  Generally, you should not lift items heavier than that you can lift simply with your hands and arms (no back muscles)  Painting:  Paint fumes do not harm your pregnancy, but may make you ill and should be avoided if possible.  Latex or water based paints have less odor than oils.  Use adequate ventilation while painting.  Permanents & Hair Color:  Chemicals in hair dyes are not recommended as they cause increase hair dryness which can increase hair loss during pregnancy.  " Highlighting" and permanents are allowed.  Dye may be absorbed differently and permanents may not hold as well during pregnancy.  Sunbathing:  Use a sunscreen, as skin burns easily during pregnancy.  Drink plenty of fluids; avoid over heating.  Tanning Beds:  Because their possible side effects are still unknown, tanning beds are not recommended.  Ultrasound Scans:  Routine ultrasounds are performed at approximately 20 weeks.  You will be able to see your baby's general anatomy an if you would like to know the gender this can usually be determined as well.  If it is questionable when you conceived you may also receive an ultrasound early in your pregnancy for dating purposes.  Otherwise ultrasound exams are not routinely performed unless there is a medical necessity.  Although you can request a scan we ask that you pay for it when conducted because insurance does not cover " patient request" scans.  Work: If your pregnancy proceeds without complications you may work until your due date, unless your physician or employer advises otherwise.  Round Ligament Pain/Pelvic Discomfort:  Sharp, shooting pains not associated with bleeding are fairly  common, usually occurring in the second trimester of pregnancy.  They   tend to be worse when standing up or when you remain standing for long periods of time.  These are the result of pressure of certain pelvic ligaments called "round ligaments".  Rest, Tylenol and heat seem to be the most effective relief.  As the womb and fetus grow, they rise out of the pelvis and the discomfort improves.  Please notify the office if your pain seems different than that described.  It may represent a more serious condition.  Hyperemesis Gravidarum Hyperemesis gravidarum is a severe form of nausea and vomiting that happens during pregnancy. Hyperemesis is worse than morning sickness. It may cause you to have nausea or vomiting all day for many days. It may keep you from eating and drinking enough food and liquids. Hyperemesis usually occurs during the first half (the first 20 weeks) of pregnancy. It often goes away once a woman is in her second half of pregnancy. However, sometimes hyperemesis continues through an entire pregnancy.  CAUSES  The cause of this condition is not completely known but is thought to be related to changes in the body's hormones when pregnant. It could be from the high level of the pregnancy hormone or an increase in estrogen in the body.  SIGNS AND SYMPTOMS   Severe nausea and vomiting.  Nausea that does not go away.  Vomiting that does not allow you to keep any food down.  Weight loss and body fluid loss (dehydration).  Having no desire to eat or not liking food you have previously enjoyed. DIAGNOSIS  Your health care provider will do a physical exam and ask you about your symptoms. He or she may also order blood tests and urine tests to make sure something else is not causing the problem.  TREATMENT  You may only need medicine to control the problem. If medicines do not control the nausea and vomiting, you will be treated in the hospital to prevent dehydration, increased acid in the  blood (acidosis), weight loss, and changes in the electrolytes in your body that may harm the unborn baby (fetus). You may need IV fluids.  HOME CARE INSTRUCTIONS   Only take over-the-counter or prescription medicines as directed by your health care provider.  Try eating a couple of dry crackers or toast in the morning before getting out of bed.  Avoid foods and smells that upset your stomach.  Avoid fatty and spicy foods.  Eat 5-6 small meals a day.  Do not drink when eating meals. Drink between meals.  For snacks, eat high-protein foods, such as cheese.  Eat or suck on things that have ginger in them. Ginger helps nausea.  Avoid food preparation. The smell of food can spoil your appetite.  Avoid iron pills and iron in your multivitamins until after 3-4 months of being pregnant. However, consult with your health care provider before stopping any prescribed iron pills. SEEK MEDICAL CARE IF:   Your abdominal pain increases.  You have a severe headache.  You have vision problems.  You are losing weight. SEEK IMMEDIATE MEDICAL CARE IF:   You are unable to keep fluids down.  You vomit blood.  You have constant nausea and vomiting.  You have excessive weakness.  You have extreme thirst.  You have dizziness or fainting.  You have a fever or persistent symptoms for more than 2-3 days.  You have a fever and your symptoms suddenly get worse. MAKE SURE YOU:   Understand these instructions.  Will watch your condition.  Will get help  right away if you are not doing well or get worse.   This information is not intended to replace advice given to you by your health care provider. Make sure you discuss any questions you have with your health care provider.   Document Released: 03/29/2005 Document Revised: 01/17/2013 Document Reviewed: 11/08/2012 Elsevier Interactive Patient Education [employer]

## 2016-02-09 NOTE — Progress Notes (Signed)
Patient ID: Angela Fox, female   DOB: 02/24/1986, 30 y.o.   MRN: [id number]  Angela Fox presents for NOB nurse interview visit. Pregnancy confirmation done 02/02/2016 by ER for LLQ pain and near syncope. Bhcg: [id number]. Ultrasound of 02/02/16 at [facility] ER showed pt pregnancy at 7.2 wk. Also treated for BV and was prescribed Flagyl. G-2. P-1001. Pregnancy education material explained and given. No cats in the home. NOB labs ordered.  HIV labs and Drug screen were explained optional and she did not decline. Drug screen ordered. PNV encouraged. Genetic screening to be ordered, unable to complete needed information at this time, pt wants to do maternIT.   Pt. To follow up with provider in 3 weeks for NOB physical.  All questions answered.

## 2016-02-10 LAB — CBC WITH DIFFERENTIAL/PLATELET
BASOS: 0 %
Basophils Absolute: 0 10*3/uL (ref 0.0–0.2)
EOS (ABSOLUTE): 0.1 10*3/uL (ref 0.0–0.4)
Eos: 1 %
HEMOGLOBIN: 12.4 g/dL (ref 11.1–15.9)
Hematocrit: 36.5 % (ref 34.0–46.6)
IMMATURE GRANULOCYTES: 0 %
Immature Grans (Abs): 0 10*3/uL (ref 0.0–0.1)
LYMPHS: 22 %
Lymphocytes Absolute: 1.7 10*3/uL (ref 0.7–3.1)
MCH: 27.4 pg (ref 26.6–33.0)
MCHC: 34 g/dL (ref 31.5–35.7)
MCV: 81 fL (ref 79–97)
MONOCYTES: 8 %
Monocytes Absolute: 0.6 10*3/uL (ref 0.1–0.9)
NEUTROS ABS: 5.3 10*3/uL (ref 1.4–7.0)
NEUTROS PCT: 69 %
PLATELETS: 226 10*3/uL (ref 150–379)
RBC: 4.52 x10E6/uL (ref 3.77–5.28)
RDW: 13.1 % (ref 12.3–15.4)
WBC: 7.7 10*3/uL (ref 3.4–10.8)

## 2016-02-10 LAB — RPR: RPR: NONREACTIVE

## 2016-02-10 LAB — ANTIBODY SCREEN: Antibody Screen: NEGATIVE

## 2016-02-10 LAB — VARICELLA ZOSTER ANTIBODY, IGG: VARICELLA: 356 {index} (ref >165)

## 2016-02-10 LAB — HEPATITIS B SURFACE ANTIGEN: HEP B S AG: NEGATIVE

## 2016-02-10 LAB — SICKLE CELL SCREEN: Sickle Cell Screen: NEGATIVE

## 2016-02-10 LAB — OB RESULTS CONSOLE VARICELLA ZOSTER ANTIBODY, IGG: VARICELLA IGG: IMMUNE

## 2016-02-10 LAB — HIV ANTIBODY (ROUTINE TESTING W REFLEX): HIV SCREEN 4TH GENERATION: NONREACTIVE

## 2016-02-10 LAB — RUBELLA SCREEN: RUBELLA: 1.35 {index} (ref >0.99)

## 2016-02-12 LAB — URINE CULTURE, OB REFLEX

## 2016-02-12 LAB — URINALYSIS, ROUTINE W REFLEX MICROSCOPIC
BILIRUBIN UA: NEGATIVE
GLUCOSE, UA: NEGATIVE
Ketones, UA: NEGATIVE
LEUKOCYTES UA: NEGATIVE
Nitrite, UA: NEGATIVE
PH UA: 6 (ref 5.0–7.5)
RBC UA: NEGATIVE
Specific Gravity, UA: >=1.03 — AB (ref 1.005–1.030)
UUROB: 1 mg/dL (ref 0.2–1.0)

## 2016-02-12 LAB — MONITOR DRUG PROFILE 14(MW)
AMPHETAMINE SCREEN URINE: NEGATIVE ng/mL
BARBITURATE SCREEN URINE: NEGATIVE ng/mL
BENZODIAZEPINE SCREEN, URINE: NEGATIVE ng/mL
Buprenorphine, Urine: NEGATIVE ng/mL
CANNABINOIDS UR QL SCN: NEGATIVE ng/mL
COCAINE(METAB.)SCREEN, URINE: NEGATIVE ng/mL
Creatinine(Crt), U: 219.2 mg/dL (ref 20.0–300.0)
Fentanyl, Urine: NEGATIVE pg/mL
MEPERIDINE SCREEN, URINE: NEGATIVE ng/mL
Methadone Screen, Urine: NEGATIVE ng/mL
OXYCODONE+OXYMORPHONE UR QL SCN: NEGATIVE ng/mL
Opiate Scrn, Ur: NEGATIVE ng/mL
PH UR, DRUG SCRN: 5.9 (ref 4.5–8.9)
PHENCYCLIDINE QUANTITATIVE URINE: NEGATIVE ng/mL
PROPOXYPHENE SCREEN URINE: NEGATIVE ng/mL
SPECIFIC GRAVITY: 1.03
Tramadol Screen, Urine: NEGATIVE ng/mL

## 2016-02-12 LAB — NICOTINE SCREEN, URINE: Cotinine Ql Scrn, Ur: NEGATIVE ng/mL

## 2016-02-12 LAB — CULTURE, OB URINE

## 2016-02-12 LAB — GC/CHLAMYDIA PROBE AMP
Chlamydia trachomatis, NAA: NEGATIVE
Neisseria gonorrhoeae by PCR: NEGATIVE

## 2016-03-02 ENCOUNTER — Ambulatory Visit (INDEPENDENT_AMBULATORY_CARE_PROVIDER_SITE_OTHER): Payer: Medicaid Other | Admitting: Obstetrics and Gynecology

## 2016-03-02 ENCOUNTER — Other Ambulatory Visit: Payer: Self-pay | Admitting: Obstetrics and Gynecology

## 2016-03-02 VITALS — BP 118/69 | HR 96 | Wt 159.2 lb

## 2016-03-02 DIAGNOSIS — Z3401 Encounter for supervision of normal first pregnancy, first trimester: Secondary | ICD-10-CM

## 2016-03-02 LAB — POCT URINALYSIS DIPSTICK
Blood, UA: NEGATIVE
Glucose, UA: NEGATIVE
KETONES UA: NEGATIVE
LEUKOCYTES UA: NEGATIVE
NITRITE UA: NEGATIVE
PH UA: 6
Spec Grav, UA: 1.01
Urobilinogen, UA: 0.2

## 2016-03-02 MED ORDER — DOXYLAMINE-PYRIDOXINE 10-10 MG PO TBEC: 2.0000 | DELAYED_RELEASE_TABLET | Freq: Every day | ORAL | 5 refills | Status: DC

## 2016-03-02 NOTE — Progress Notes (Signed)
NEW OB HISTORY AND PHYSICAL  SUBJECTIVE:       Angela Fox is a 30 y.o. [id number] female, Patient's last menstrual period was 11/11/2015 (exact date)., Estimated Date of Delivery: 09/18/16, [id number], presents today for establishment of Prenatal Care. She has no unusual complaints and complains of occasional food aversion      Gynecologic History Patient's last menstrual period was 11/11/2015 (exact date). Normal Contraception: none Last Pap: ?Marland Kitchen. Results were: h/o abnormal ones in the past  Obstetric History OB History  Gravida Para Term Preterm AB Living  2 1 1     1   SAB TAB Ectopic Multiple Live Births          1    # Outcome Date GA Lbr Len/2nd Weight Sex Delivery Anes PTL Lv  2 Current           1 Term 09/2007 [id number]  6 lb 6 oz (2.892 kg) M Vag-Spont  N LIV      Past Medical History:  Diagnosis Date  . Abnormal Pap smear of cervix   . Amenorrhea   . Endometriosis     Past Surgical History:  Procedure Laterality Date  . CERVICAL BIOPSY  W/ LOOP ELECTRODE EXCISION     pt was 15-16yo  . LEEP      Current Outpatient Prescriptions on File Prior to Visit  Medication Sig Dispense Refill  . Prenatal Vit-Fe Fumarate-FA (MULTIVITAMIN-PRENATAL) 27-0.8 MG TABS tablet Take 1 tablet by mouth daily at 12 noon.    . metroNIDAZOLE (FLAGYL) 500 MG tablet Take 1 tablet (500 mg total) by mouth 2 (two) times daily after a meal. (Patient not taking: Reported on 03/02/2016) 14 tablet 0   No current facility-administered medications on file prior to visit.     No Known Allergies  Social History   Social History  . Marital status: Single    Spouse name: N/A  . Number of children: N/A  . Years of education: N/A   Occupational History  . [employer]   Social History Main Topics  . Smoking status: Never Smoker  . Smokeless tobacco: Never Used  . Alcohol use Yes  . Drug use: No  . Sexual activity: Yes    Partners: Male   Other Topics Concern  . Not on file    Social History Narrative  . No narrative on file    Family History  Problem Relation Age of Onset  . Cancer Mother   . Migraines Mother   . Seizures Mother   . Stroke Mother   . Rheum arthritis Maternal Grandmother   . Rheum arthritis Maternal Grandfather     The following portions of the patient's history were reviewed and updated as appropriate: allergies, current medications, past OB history, past medical history, past surgical history, past family history, past social history, and problem list.    OBJECTIVE: Initial Physical Exam (New OB)  GENERAL APPEARANCE: alert, well appearing, in no apparent distress, oriented to person, place and time HEAD: normocephalic, atraumatic MOUTH: mucous membranes moist, pharynx normal without lesions and dental hygiene good THYROID: no thyromegaly or masses present BREASTS: not examined LUNGS: not examined HEART: regular rate and rhythm, no murmurs ABDOMEN: soft, nontender, nondistended, no abnormal masses, no epigastric pain, fundus not palpable and FHT present EXTREMITIES: no redness or tenderness in the calves or thighs SKIN: normal coloration and turgor, no rashes LYMPH NODES: no adenopathy palpable NEUROLOGIC: alert, oriented, normal speech, no focal findings or movement disorder noted  PELVIC EXAM EXTERNAL GENITALIA: normal appearing vulva with no masses, tenderness or lesions VAGINA: no abnormal discharge or lesions CERVIX: no lesions or cervical motion tenderness  ASSESSMENT: Normal pregnancy  PLAN: Prenatal care See orders

## 2016-03-02 NOTE — Progress Notes (Signed)
NOB - pt is feeling ok, doesn't have much of an appetite, she is having some nausea

## 2016-03-03 LAB — CYTOLOGY - PAP

## 2016-03-09 ENCOUNTER — Encounter: Payer: Self-pay | Admitting: Obstetrics and Gynecology

## 2016-03-10 ENCOUNTER — Ambulatory Visit (INDEPENDENT_AMBULATORY_CARE_PROVIDER_SITE_OTHER): Payer: Medicaid Other | Admitting: Obstetrics and Gynecology

## 2016-03-10 DIAGNOSIS — Z23 Encounter for immunization: Secondary | ICD-10-CM

## 2016-03-31 ENCOUNTER — Ambulatory Visit (INDEPENDENT_AMBULATORY_CARE_PROVIDER_SITE_OTHER): Payer: Medicaid Other | Admitting: Obstetrics and Gynecology

## 2016-03-31 VITALS — BP 117/70 | HR 87 | Wt 160.0 lb

## 2016-03-31 DIAGNOSIS — Z3492 Encounter for supervision of normal pregnancy, unspecified, second trimester: Secondary | ICD-10-CM

## 2016-03-31 LAB — POCT URINALYSIS DIPSTICK
BILIRUBIN UA: NEGATIVE
GLUCOSE UA: NEGATIVE
Ketones, UA: NEGATIVE
LEUKOCYTES UA: NEGATIVE
NITRITE UA: NEGATIVE
Protein, UA: NEGATIVE
RBC UA: NEGATIVE
Spec Grav, UA: <=1.005
UROBILINOGEN UA: 0.2
pH, UA: 6.5

## 2016-03-31 NOTE — Progress Notes (Signed)
ROB- pt is doing well, she is having some headaches- advised tylenol is safe

## 2016-04-01 NOTE — Progress Notes (Addendum)
ROB-Pt doing well. Discussed sleep hygiene and use of OTC medications for sleep like Unisom and Tylenol Pm. Reviewed red flag symptoms and reason to call. RTC x 4-5 wks for anatomy scan and ROB.

## 2016-04-12 NOTE — L&D Delivery Note (Signed)
Delivery Note At  0139am a viable and healthy female "Samson Fredericlla" was delivered via  (Presentation:LOA ;  ).  APGAR: 8, 9  .   Placenta status: delivered intact spontaneously with 3 vessel Cord:  with the following complications: [place] x2, delivered infant through  Anesthesia:  epidural Episiotomy:  none Lacerations:  none Suture Repair: NA Est. Blood Loss (mL):  150  Mom to postpartum.  Baby to Couplet care / Skin to Skin.  Melody NIKE Shambley, CNM 09/22/2016, 1:47 AM

## 2016-04-13 ENCOUNTER — Encounter: Payer: Self-pay | Admitting: Obstetrics and Gynecology

## 2016-04-28 ENCOUNTER — Encounter: Payer: Medicaid Other | Admitting: Obstetrics and Gynecology

## 2016-04-28 ENCOUNTER — Other Ambulatory Visit: Payer: Medicaid Other

## 2016-04-30 ENCOUNTER — Telehealth: Payer: Self-pay | Admitting: Certified Nurse Midwife

## 2016-04-30 ENCOUNTER — Ambulatory Visit (INDEPENDENT_AMBULATORY_CARE_PROVIDER_SITE_OTHER): Payer: Medicaid Other | Admitting: Certified Nurse Midwife

## 2016-04-30 ENCOUNTER — Ambulatory Visit (INDEPENDENT_AMBULATORY_CARE_PROVIDER_SITE_OTHER): Payer: Medicaid Other

## 2016-04-30 VITALS — BP 102/74 | HR 87 | Wt 162.2 lb

## 2016-04-30 DIAGNOSIS — Z3492 Encounter for supervision of normal pregnancy, unspecified, second trimester: Secondary | ICD-10-CM

## 2016-04-30 DIAGNOSIS — O4402 Placenta previa specified as without hemorrhage, second trimester: Secondary | ICD-10-CM

## 2016-04-30 DIAGNOSIS — Z3401 Encounter for supervision of normal first pregnancy, first trimester: Secondary | ICD-10-CM | POA: Diagnosis not present

## 2016-04-30 LAB — POCT URINALYSIS DIPSTICK
Bilirubin, UA: NEGATIVE
Blood, UA: NEGATIVE
Glucose, UA: NEGATIVE
Ketones, UA: NEGATIVE
LEUKOCYTES UA: NEGATIVE
Nitrite, UA: NEGATIVE
SPEC GRAV UA: 1.015
UROBILINOGEN UA: 0.2
pH, UA: 6.5

## 2016-04-30 NOTE — Telephone Encounter (Signed)
PT CALLED AND SHE WAS HERE THIS AM AND SHE WANTED TO KNOW IF SHE CAN STILL WORK OUT EVEN THOUGH SHE HAS THE PLACENTA ISSUE, PT WOULD LIKE A CALL BACK.

## 2016-04-30 NOTE — Progress Notes (Signed)
ROB- anatomy scan done today, we discussed sleep issues, she is going to try some lavendar, otherwise she is doing great

## 2016-04-30 NOTE — Telephone Encounter (Signed)
Called pt, verified full name and date of birth.   Angela Fox questions if she can continue her current exercise routine after diagnosis of complete previa. Her exercise routine is  "intense and includes arms and legs, mostly squats".   Advise pt she was okay to walk and work arms, but squatting and intense leg work and lifting are not advised at this time.   Pt verbalized understanding. Will call with any further needs, questions, or concerns.    Gunnar BullaJenkins Michelle Romolo Sieling, CNM

## 2016-04-30 NOTE — Progress Notes (Signed)
ROB-Pt doing well. Reports increased vaginal discharge. Denies vaginal itching, bleeding, change of smell or color. Discussed leukorrhea of pregnancy and hygiene measures. Reviewed US findings: female-normal anatomy, previa-rpt US at 28 wks, pt verbalized understanding. Previa handout and precautions given. RTC x 4 weeks or sooner if needed.

## 2016-04-30 NOTE — Progress Notes (Deleted)
ROB

## 2016-04-30 NOTE — Patient Instructions (Signed)
Placenta Previa Placenta previa is a condition in which the placenta implants in the lower part of the uterus in pregnant women. The placenta either partially or completely covers the opening to the cervix. This is a problem because the baby must pass through the cervix during delivery. There are three types of placenta previa:  Marginal placenta previa. The placenta reaches within an inch (2.5 cm) of the cervical opening but does not cover it.  Partial placenta previa. The placenta covers part of the cervical opening.  Complete placenta previa. The placenta covers the entire cervical opening. If the previa is marginal or partial and it is diagnosed in the first half of pregnancy, the placenta may move into a normal position as the pregnancy progresses and may no longer cover the cervix. It is important to keep all prenatal visits with your health care provider so you can be more closely monitored. What are the causes? The cause of this condition is not known. What increases the risk? This condition is more likely to develop in women who:  Are carrying more than one baby (multiples).  Have an abnormally shaped uterus.  Have scars on the lining of the uterus.  Have had surgeries involving the uterus, such as a cesarean delivery.  Have delivered a baby before.  Have a history of placenta previa.  Have smoked or used cocaine during pregnancy.  Are age 35 or older during pregnancy. What are the signs or symptoms? The main symptom of this condition is sudden, painless vaginal bleeding during the second half of pregnancy. The amount of bleeding can be very light at first, and it usually stops on its own. Heavier bleeding episodes may also happen. Some women with placenta previa may have no bleeding at all. How is this diagnosed?  This condition is diagnosed:  From an ultrasound. This test uses sound waves to find where the placenta is located before you have any bleeding  episodes.  During a checkup after vaginal bleeding is noticed.  If you are diagnosed with a partial or complete previa, digital exams with fingers will generally be avoided. Your health care provider will still perform a speculum exam.  If you did not have an ultrasound during your pregnancy, placenta previa may not be diagnosed until bleeding occurs during labor. How is this treated? Treatment for this condition may include:  Decreased activity.  Bed rest at home or in the hospital.  Pelvic rest. Nothing is placed inside the vagina during pelvic rest. This means not having sex and not using tampons or douches.  A blood transfusion to replace blood that you have lost (maternal blood loss).  A cesarean delivery. This may be performed if:  The bleeding is heavy and cannot be controlled.  The placenta completely covers the cervix.  Medicines to stop premature labor or to help the baby's lungs to mature. This treatment may be used if you need delivery before your pregnancy is full-term. Your treatment will be decided based on:  How much you are bleeding, or whether the bleeding has stopped.  How far along you are in your pregnancy.  The condition of your baby.  The type of placenta previa that you have. Follow these instructions at home:  Get plenty of rest and lessen activity as told by your health care provider.  Stay on bed rest for as long as told by your health care provider.  Do not have sex, use tampons, use a douche, or place anything inside of your   vagina if your health care provider recommended pelvic rest.  Take over-the-counter and prescription medicines as told by your health care provider.  Keep all follow-up visits as told by your health care provider. This is important. Get help right away if:  You have vaginal bleeding, even if in small amounts and even if you have no pain.  You have cramping or regular contractions.  You have pain in your abdomen or  your lower back.  You have a feeling of increased pressure in your pelvis.  You have increased watery or bloody mucus from the vagina. This information is not intended to replace advice given to you by your health care provider. Make sure you discuss any questions you have with your health care provider. Document Released: 03/29/2005 Document Revised: 12/17/2015 Document Reviewed: 10/11/2015 Elsevier Interactive Patient Education  2017 Elsevier Inc.  

## 2016-05-13 ENCOUNTER — Encounter: Payer: Self-pay | Admitting: Obstetrics and Gynecology

## 2016-05-19 ENCOUNTER — Encounter: Payer: Self-pay | Admitting: Obstetrics and Gynecology

## 2016-05-28 ENCOUNTER — Ambulatory Visit (INDEPENDENT_AMBULATORY_CARE_PROVIDER_SITE_OTHER): Payer: Medicaid Other | Admitting: Obstetrics and Gynecology

## 2016-05-28 VITALS — BP 118/60 | HR 88 | Wt 166.5 lb

## 2016-05-28 DIAGNOSIS — Z3492 Encounter for supervision of normal pregnancy, unspecified, second trimester: Secondary | ICD-10-CM

## 2016-05-28 LAB — POCT URINALYSIS DIPSTICK
BILIRUBIN UA: NEGATIVE
Blood, UA: NEGATIVE
Glucose, UA: NEGATIVE
KETONES UA: NEGATIVE
LEUKOCYTES UA: NEGATIVE
NITRITE UA: NEGATIVE
PH UA: 7
PROTEIN UA: NEGATIVE
Spec Grav, UA: 1.01
Urobilinogen, UA: 0.2

## 2016-05-28 NOTE — Progress Notes (Signed)
ROB- pt is doing well denies any complaints 

## 2016-05-28 NOTE — Progress Notes (Signed)
ROB- doing well, will do glucola and f/u u/s next visit.

## 2016-06-25 ENCOUNTER — Other Ambulatory Visit: Payer: Self-pay | Admitting: Certified Nurse Midwife

## 2016-06-25 ENCOUNTER — Other Ambulatory Visit: Payer: Medicaid Other

## 2016-06-25 ENCOUNTER — Encounter: Payer: Self-pay | Admitting: Certified Nurse Midwife

## 2016-06-25 ENCOUNTER — Ambulatory Visit (INDEPENDENT_AMBULATORY_CARE_PROVIDER_SITE_OTHER): Payer: Medicaid Other

## 2016-06-25 ENCOUNTER — Ambulatory Visit (INDEPENDENT_AMBULATORY_CARE_PROVIDER_SITE_OTHER): Payer: Medicaid Other | Admitting: Certified Nurse Midwife

## 2016-06-25 VITALS — BP 94/55 | HR 100 | Wt 171.2 lb

## 2016-06-25 DIAGNOSIS — Z23 Encounter for immunization: Secondary | ICD-10-CM | POA: Diagnosis not present

## 2016-06-25 DIAGNOSIS — O4403 Placenta previa specified as without hemorrhage, third trimester: Secondary | ICD-10-CM | POA: Diagnosis not present

## 2016-06-25 DIAGNOSIS — Z3482 Encounter for supervision of other normal pregnancy, second trimester: Secondary | ICD-10-CM

## 2016-06-25 LAB — POCT URINALYSIS DIPSTICK
BILIRUBIN UA: NEGATIVE
GLUCOSE UA: NEGATIVE
Ketones, UA: NEGATIVE
Leukocytes, UA: NEGATIVE
NITRITE UA: NEGATIVE
Protein, UA: NEGATIVE
RBC UA: NEGATIVE
Spec Grav, UA: 1.01 (ref 1.030–1.035)
UROBILINOGEN UA: NEGATIVE (ref ?–2.0)
pH, UA: 5 (ref 5.0–8.0)

## 2016-06-25 NOTE — Patient Instructions (Signed)

## 2016-06-25 NOTE — Progress Notes (Signed)
ROB-Pt doing well. Reviewed follow up US results, previa reKoreasolved. Pt desires delayed cord clamping, breastfeeding, and PP Depo. Schedule of childbirth classes given. Pt may resume exercise. RTC x 2 weeks for ROB.   ULTRASOUND REPORT  Location: [facility] Date of Service: 06/25/16  Indications: Check placental location  Findings:  Mason JimSingleton intrauterine pregnancy is visualized with FHR at 144 BPM.  Fetal presentation is vertex, spine posterior.  Placenta: The anterior placenta is no longer at a previa. It is now over 7 cm ( 7.9 cm) from the cervical os. Grade is 1. AFI: Subjectively adequate.  Anatomic survey of the fetal stomach, bladder and kidneys appears WNL. Gender - Female.   Impression: 1. The previously seen placenta previa has now completely resolved with the tip of the anterior placenta at more than 7 cm from the cervical os.   Gunnar BullaJenkins Michelle Deandra Gadson, CNM

## 2016-06-26 LAB — GLUCOSE TOLERANCE, 1 HOUR: GLUCOSE, 1HR PP: 111 mg/dL (ref 65–199)

## 2016-06-26 LAB — HEMOGLOBIN AND HEMATOCRIT, BLOOD
Hematocrit: 36.9 % (ref 34.0–46.6)
Hemoglobin: 12.3 g/dL (ref 11.1–15.9)

## 2016-07-08 ENCOUNTER — Encounter: Payer: Medicaid Other | Admitting: Certified Nurse Midwife

## 2016-07-11 ENCOUNTER — Ambulatory Visit
Admission: EM | Admit: 2016-07-11 | Discharge: 2016-07-11 | Disposition: A | Discharge status: Home / Self Care | Payer: Medicaid Other | Attending: Family Medicine | Admitting: Family Medicine

## 2016-07-11 ENCOUNTER — Encounter: Payer: Self-pay | Admitting: Emergency Medicine

## 2016-07-11 DIAGNOSIS — B349 Viral infection, unspecified: Secondary | ICD-10-CM | POA: Diagnosis not present

## 2016-07-11 DIAGNOSIS — J029 Acute pharyngitis, unspecified: Secondary | ICD-10-CM

## 2016-07-11 LAB — RAPID STREP SCREEN (MED CTR MEBANE ONLY): Streptococcus, Group A Screen (Direct): NEGATIVE

## 2016-07-11 NOTE — ED Triage Notes (Signed)
Patient reports swelling in her lip that started yesterday.  Patient reports some itching on her lip that started today.

## 2016-07-11 NOTE — Discharge Instructions (Signed)
Benadryl prn, fluids

## 2016-07-11 NOTE — ED Provider Notes (Signed)
[facility]    CSN: [id number] Arrival date & time: 07/11/16  1415     History   Chief Complaint Chief Complaint  Patient presents with  . Oral Swelling    lip    HPI Angela Fox is a 31 y.o. female.   The history is provided by the patient.  Sore Throat  This is a new problem. The current episode started 12 to 24 hours ago. The problem occurs constantly. The problem has not changed since onset.Pertinent negatives include no chest pain, no abdominal pain, no headaches and no shortness of breath. Associated symptoms comments: Patient reports one day h/o upper lip "itching", "tingling" and slight swelling; denies any throat swelling, wheezing or difficulty breathing. .    Past Medical History:  Diagnosis Date  . Abnormal Pap smear of cervix   . Amenorrhea   . Endometriosis     There are no active problems to display for this patient.   Past Surgical History:  Procedure Laterality Date  . CERVICAL BIOPSY  W/ LOOP ELECTRODE EXCISION     pt was 15-16yo  . LEEP      OB History    Gravida Para Term Preterm AB Living   SAB TAB Ectopic Multiple Live Births           1       Home Medications    Prior to Admission medications   Medication Sig Start Date End Date Taking? Authorizing Provider  Prenatal Vit-Fe Fumarate-FA (MULTIVITAMIN-PRENATAL) 27-0.8 MG TABS tablet Take 1 tablet by mouth daily at 12 noon.    Historical Provider, MD    Family History Family History  Problem Relation Age of Onset  . Cancer Mother   . Migraines Mother   . Seizures Mother   . Stroke Mother   . Rheum arthritis Maternal Grandmother   . Rheum arthritis Maternal Grandfather     Social History Social History  Substance Use Topics  . Smoking status: Never Smoker  . Smokeless tobacco: Never Used  . Alcohol use No     Allergies   Patient has no known allergies.   Review of Systems Review of Systems  Respiratory: Negative for shortness of breath.     Cardiovascular: Negative for chest pain.  Gastrointestinal: Negative for abdominal pain.  Neurological: Negative for headaches.     Physical Exam Triage Vital Signs ED Triage Vitals  Enc Vitals Group     BP 07/11/16 1426 122/87     Pulse Rate 07/11/16 1426 99     Resp 07/11/16 1426 16     Temp 07/11/16 1426 99.5 F (37.5 C)     Temp Source 07/11/16 1426 Oral     SpO2 07/11/16 1426 100 %     Weight 07/11/16 1425 170 lb (77.1 kg)     Height 07/11/16 1425  (1.803 m)     Head Circumference --      Peak Flow --      Pain Score 07/11/16 1425 0     Pain Loc --      Pain Edu? --      Excl. in GC? --    No data found.   Updated Vital Signs BP 122/87 (BP Location: Left Arm)   Pulse 99   Temp 99.5 F (37.5 C) (Oral)   Resp 16   Ht  (1.803 m)   Wt 170 lb (77.1 kg)  LMP 11/11/2015 (Exact Date)   SpO2 100%   BMI 23.71 kg/m   Visual Acuity Right Eye Distance:   Left Eye Distance:   Bilateral Distance:    Right Eye Near:   Left Eye Near:    Bilateral Near:     Physical Exam  Constitutional: She appears well-developed and well-nourished. No distress.  HENT:  Head: Normocephalic and atraumatic.  Right Ear: Tympanic membrane, external ear and ear canal normal.  Left Ear: Tympanic membrane, external ear and ear canal normal.  Nose: Rhinorrhea present. No mucosal edema, nose lacerations, sinus tenderness, nasal deformity, septal deviation or nasal septal hematoma. No epistaxis.  No foreign bodies. Right sinus exhibits no maxillary sinus tenderness and no frontal sinus tenderness. Left sinus exhibits no maxillary sinus tenderness and no frontal sinus tenderness.  Mouth/Throat: Uvula is midline and mucous membranes are normal. No uvula swelling. Posterior oropharyngeal erythema present. No oropharyngeal exudate, posterior oropharyngeal edema or tonsillar abscesses. No tonsillar exudate.  Multiple pinpoint tan/skin colored papules on upper lip; mild edema noted   Eyes: Conjunctivae and EOM are normal. Pupils are equal, round, and reactive to light. Right eye exhibits no discharge. Left eye exhibits no discharge. No scleral icterus.  Neck: Normal range of motion. Neck supple. No thyromegaly present.  Cardiovascular: Normal rate, regular rhythm and normal heart sounds.   Pulmonary/Chest: Effort normal and breath sounds normal. No respiratory distress. She has no wheezes. She has no rales.  Musculoskeletal: She exhibits no edema.  Lymphadenopathy:    She has no cervical adenopathy.  Skin: No rash noted. She is not diaphoretic.  Nursing note and vitals reviewed.    UC Treatments / Results  Labs (all labs ordered are listed, but only abnormal results are displayed) Labs Reviewed  RAPID STREP SCREEN (NOT AT [facility])  CULTURE, GROUP A STREP [facility])    EKG  EKG Interpretation None       Radiology No results found.  Procedures Procedures (including critical care time)  Medications Ordered in UC Medications - No data to display   Initial Impression / Assessment and Plan / UC Course  I have reviewed the triage vital signs and the nursing notes.  Pertinent labs & imaging results that were available during my care of the patient were reviewed by me and considered in my medical decision making (see chart for details).       Final Clinical Impressions(s) / UC Diagnoses   Final diagnoses:  Viral syndrome  Viral pharyngitis    New Prescriptions New Prescriptions   No medications on file   1. Lab results and diagnosis reviewed with patient 2. Recommend supportive treatment with fluids, benadryl as needed, salt water gargles 3. Follow-up prn if symptoms worsen or don't improve   Payton Mccallum, MD 07/11/16 1454

## 2016-07-11 NOTE — ED Triage Notes (Signed)
Patient also reports sore scratchy throat that started couple days ago.

## 2016-07-12 ENCOUNTER — Encounter: Payer: Medicaid Other | Admitting: Certified Nurse Midwife

## 2016-07-14 LAB — CULTURE, GROUP A STREP (THRC)

## 2016-07-15 ENCOUNTER — Encounter: Payer: Self-pay | Admitting: Certified Nurse Midwife

## 2016-07-15 ENCOUNTER — Ambulatory Visit (INDEPENDENT_AMBULATORY_CARE_PROVIDER_SITE_OTHER): Payer: Medicaid Other | Admitting: Certified Nurse Midwife

## 2016-07-15 VITALS — BP 105/67 | HR 84 | Wt 173.3 lb

## 2016-07-15 DIAGNOSIS — Z3483 Encounter for supervision of other normal pregnancy, third trimester: Secondary | ICD-10-CM

## 2016-07-15 LAB — POCT URINALYSIS DIPSTICK
Bilirubin, UA: NEGATIVE
Blood, UA: NEGATIVE
GLUCOSE UA: NEGATIVE
Ketones, UA: NEGATIVE
Leukocytes, UA: NEGATIVE
Nitrite, UA: NEGATIVE
Protein, UA: NEGATIVE
SPEC GRAV UA: 1.015 (ref 1.030–1.035)
UROBILINOGEN UA: NEGATIVE (ref ?–2.0)
pH, UA: 7 (ref 5.0–8.0)

## 2016-07-15 NOTE — Progress Notes (Signed)
ROB-Pt doing well. Discussed round ligament pain and home treatment measures including abdominal support. Reviewed red flag symptoms and when to call. RTC x 2 weeks for ROB.

## 2016-07-15 NOTE — Patient Instructions (Signed)
Round Ligament Pain The round ligament is a cord of muscle and tissue that helps to support the uterus. It can become a source of pain during pregnancy if it becomes stretched or twisted as the baby grows. The pain usually begins in the second trimester of pregnancy, and it can come and go until the baby is delivered. It is not a serious problem, and it does not cause harm to the baby. Round ligament pain is usually a short, sharp, and pinching pain, but it can also be a dull, lingering, and aching pain. The pain is felt in the lower side of the abdomen or in the groin. It usually starts deep in the groin and moves up to the outside of the hip area. Pain can occur with:  A sudden change in position.  Rolling over in bed.  Coughing or sneezing.  Physical activity. Follow these instructions at home: Watch your condition for any changes. Take these steps to help with your pain:  When the pain starts, relax. Then try:  Sitting down.  Flexing your knees up to your abdomen.  Lying on your side with one pillow under your abdomen and another pillow between your legs.  Sitting in a warm bath for 15-20 minutes or until the pain goes away.  Take over-the-counter and prescription medicines only as told by your health care provider.  Move slowly when you sit and stand.  Avoid long walks if they cause pain.  Stop or lessen your physical activities if they cause pain. Contact a health care provider if:  Your pain does not go away with treatment.  You feel pain in your back that you did not have before.  Your medicine is not helping. Get help right away if:  You develop a fever or chills.  You develop uterine contractions.  You develop vaginal bleeding.  You develop nausea or vomiting.  You develop diarrhea.  You have pain when you urinate. This information is not intended to replace advice given to you by your health care provider. Make sure you discuss any questions you have with  your health care provider. Document Released: 01/06/2008 Document Revised: 09/04/2015 Document Reviewed: 06/05/2014 Elsevier Interactive Patient Education  2017 Elsevier Inc.  

## 2016-08-02 ENCOUNTER — Ambulatory Visit (INDEPENDENT_AMBULATORY_CARE_PROVIDER_SITE_OTHER): Payer: BLUE CROSS/BLUE SHIELD | Admitting: Certified Nurse Midwife

## 2016-08-02 VITALS — BP 101/66 | HR 87 | Wt 173.0 lb

## 2016-08-02 DIAGNOSIS — N898 Other specified noninflammatory disorders of vagina: Secondary | ICD-10-CM

## 2016-08-02 DIAGNOSIS — Z3493 Encounter for supervision of normal pregnancy, unspecified, third trimester: Secondary | ICD-10-CM

## 2016-08-02 LAB — POCT URINALYSIS DIPSTICK
Bilirubin, UA: NEGATIVE
Blood, UA: NEGATIVE
Glucose, UA: NEGATIVE
Ketones, UA: NEGATIVE
Leukocytes, UA: NEGATIVE
Nitrite, UA: NEGATIVE
SPEC GRAV UA: 1.015 (ref 1.010–1.025)
UROBILINOGEN UA: 0.2 U/dL
pH, UA: 6 (ref 5.0–8.0)

## 2016-08-02 NOTE — Progress Notes (Signed)
ROB, doing well. Complains of copious mucous clear discharge. No burning, itching or odor. Wet prep & KOH negative. Discussed normal discharge during pregnancy. She denies LOF, Vaginal bleeding and contractions. Discussed GBS culture at next visit. ROB in 2 1/2 wks.  PTL precautions reviewed.   Doreene Burke, CNM

## 2016-08-02 NOTE — Progress Notes (Signed)
ROB- pt is doing well, she is having a lot of vaginal d/c

## 2016-08-03 ENCOUNTER — Encounter: Payer: Self-pay | Admitting: Obstetrics and Gynecology

## 2016-08-03 ENCOUNTER — Other Ambulatory Visit: Payer: Self-pay | Admitting: *Deleted

## 2016-08-03 MED ORDER — VALACYCLOVIR HCL 500 MG PO TABS: 500.0000 mg | ORAL_TABLET | Freq: Two times a day (BID) | ORAL | 4 refills | Status: DC

## 2016-08-12 ENCOUNTER — Encounter: Payer: Medicaid Other | Admitting: Certified Nurse Midwife

## 2016-08-13 ENCOUNTER — Encounter: Payer: Self-pay | Admitting: Certified Nurse Midwife

## 2016-08-19 ENCOUNTER — Encounter: Payer: Self-pay | Admitting: Certified Nurse Midwife

## 2016-08-19 ENCOUNTER — Ambulatory Visit (INDEPENDENT_AMBULATORY_CARE_PROVIDER_SITE_OTHER): Payer: Medicaid Other | Admitting: Certified Nurse Midwife

## 2016-08-19 VITALS — BP 114/67 | HR 90 | Wt 178.7 lb

## 2016-08-19 DIAGNOSIS — Z8619 Personal history of other infectious and parasitic diseases: Secondary | ICD-10-CM | POA: Insufficient documentation

## 2016-08-19 DIAGNOSIS — Z3493 Encounter for supervision of normal pregnancy, unspecified, third trimester: Secondary | ICD-10-CM

## 2016-08-19 DIAGNOSIS — O98513 Other viral diseases complicating pregnancy, third trimester: Secondary | ICD-10-CM

## 2016-08-19 DIAGNOSIS — Z113 Encounter for screening for infections with a predominantly sexual mode of transmission: Secondary | ICD-10-CM

## 2016-08-19 DIAGNOSIS — Z369 Encounter for antenatal screening, unspecified: Secondary | ICD-10-CM

## 2016-08-19 DIAGNOSIS — B009 Herpesviral infection, unspecified: Secondary | ICD-10-CM

## 2016-08-19 LAB — OB RESULTS CONSOLE GBS: GBS: NEGATIVE

## 2016-08-19 NOTE — Patient Instructions (Signed)

## 2016-08-19 NOTE — Progress Notes (Signed)
ROB-Pt doing well, out of work until August. [place]. Discussed signs and symptoms of labor and home preparation techniques. Advised HSV prophylaxis for duration of pregnancy. 36 week cultures collected. Reviewed red flag symptoms and when to call. RTC x 1 week for ROB.

## 2016-08-21 LAB — STREP GP B NAA+RFLX: STREP GP B NAA+RFLX: NEGATIVE

## 2016-08-21 LAB — GC/CHLAMYDIA PROBE AMP
CHLAMYDIA, DNA PROBE: NEGATIVE
Neisseria gonorrhoeae by PCR: NEGATIVE

## 2016-08-26 ENCOUNTER — Encounter: Payer: Self-pay | Admitting: Certified Nurse Midwife

## 2016-08-26 ENCOUNTER — Ambulatory Visit (INDEPENDENT_AMBULATORY_CARE_PROVIDER_SITE_OTHER): Payer: Medicaid Other | Admitting: Certified Nurse Midwife

## 2016-08-26 VITALS — BP 112/70 | HR 82 | Wt 176.1 lb

## 2016-08-26 DIAGNOSIS — Z3493 Encounter for supervision of normal pregnancy, unspecified, third trimester: Secondary | ICD-10-CM | POA: Diagnosis not present

## 2016-08-26 LAB — POCT URINALYSIS DIPSTICK
Bilirubin, UA: NEGATIVE
GLUCOSE UA: NEGATIVE
Ketones, UA: NEGATIVE
Leukocytes, UA: NEGATIVE
NITRITE UA: NEGATIVE
Protein, UA: NEGATIVE
RBC UA: NEGATIVE
SPEC GRAV UA: 1.01 (ref 1.010–1.025)
UROBILINOGEN UA: 0.2 U/dL
pH, UA: 6.5 (ref 5.0–8.0)

## 2016-08-26 NOTE — Progress Notes (Signed)
ROB-Pt doing well, reports watery vaginal discharge that left "a spot on the couch last night". Denies leakage of fluid, vaginal bleeding, or contractions. Nitrazine negative, Fern negative, and pooling negative. Advised pt to removal clitoral piercing prior to labor and birth. Reviewed red flag symptoms and when to call. RTC x 1 week or sooner if needed.

## 2016-08-26 NOTE — Patient Instructions (Signed)

## 2016-08-27 ENCOUNTER — Encounter: Payer: Self-pay | Admitting: Certified Nurse Midwife

## 2016-09-01 ENCOUNTER — Other Ambulatory Visit: Payer: Self-pay

## 2016-09-01 ENCOUNTER — Encounter: Payer: Medicaid Other | Admitting: Certified Nurse Midwife

## 2016-09-01 MED ORDER — FLUCONAZOLE 150 MG PO TABS: 150.0000 mg | ORAL_TABLET | Freq: Once | ORAL | 0 refills | Status: AC

## 2016-09-03 ENCOUNTER — Ambulatory Visit (INDEPENDENT_AMBULATORY_CARE_PROVIDER_SITE_OTHER): Payer: Medicaid Other | Admitting: Obstetrics and Gynecology

## 2016-09-03 VITALS — BP 118/71 | HR 93 | Wt 175.4 lb

## 2016-09-03 DIAGNOSIS — Z3493 Encounter for supervision of normal pregnancy, unspecified, third trimester: Secondary | ICD-10-CM

## 2016-09-03 DIAGNOSIS — B373 Candidiasis of vulva and vagina: Secondary | ICD-10-CM

## 2016-09-03 DIAGNOSIS — B3731 Acute candidiasis of vulva and vagina: Secondary | ICD-10-CM

## 2016-09-03 LAB — POCT URINALYSIS DIPSTICK
Bilirubin, UA: NEGATIVE
Blood, UA: NEGATIVE
Glucose, UA: NEGATIVE
Ketones, UA: NEGATIVE
Leukocytes, UA: NEGATIVE
Nitrite, UA: NEGATIVE
Protein, UA: NEGATIVE
Spec Grav, UA: 1.015 (ref 1.010–1.025)
Urobilinogen, UA: 0.2 E.U./dL
pH, UA: 6 (ref 5.0–8.0)

## 2016-09-03 MED ORDER — TERCONAZOLE 0.4 % VA CREA: 1.0000 | TOPICAL_CREAM | Freq: Every day | VAGINAL | 0 refills | Status: DC

## 2016-09-03 NOTE — Progress Notes (Signed)
ROB-treated self for yeast infection,still having symptoms of yeast, labor precautions discussed.

## 2016-09-03 NOTE — Progress Notes (Signed)
ROB- pt is having pelvic pressure, lower abdominal cramping

## 2016-09-07 ENCOUNTER — Encounter: Payer: Self-pay | Admitting: Obstetrics and Gynecology

## 2016-09-10 ENCOUNTER — Ambulatory Visit (INDEPENDENT_AMBULATORY_CARE_PROVIDER_SITE_OTHER): Payer: Medicaid Other | Admitting: Certified Nurse Midwife

## 2016-09-10 ENCOUNTER — Encounter: Payer: Self-pay | Admitting: Certified Nurse Midwife

## 2016-09-10 VITALS — BP 103/64 | HR 76 | Wt 179.0 lb

## 2016-09-10 DIAGNOSIS — Z3493 Encounter for supervision of normal pregnancy, unspecified, third trimester: Secondary | ICD-10-CM

## 2016-09-10 LAB — POCT URINALYSIS DIPSTICK
Bilirubin, UA: NEGATIVE
Blood, UA: NEGATIVE
GLUCOSE UA: NEGATIVE
KETONES UA: NEGATIVE
LEUKOCYTES UA: NEGATIVE
Nitrite, UA: NEGATIVE
PROTEIN UA: NEGATIVE
Spec Grav, UA: 1.01 (ref 1.010–1.025)
UROBILINOGEN UA: 0.2 U/dL
pH, UA: 7 (ref 5.0–8.0)

## 2016-09-10 NOTE — Patient Instructions (Addendum)
Common Medications Safe in Pregnancy  Acne:      Constipation:  Benzoyl Peroxide     Colace  Clindamycin      Dulcolax Suppository  Topica Erythromycin     Fibercon  Salicylic Acid      Metamucil         Miralax AVOID:        Senakot   Accutane    Cough:  Retin-A       Cough Drops  Tetracycline      Phenergan w/ Codeine if Rx  Minocycline      Robitussin (Plain & DM)  Antibiotics:     Crabs/Lice:  Ceclor       RID  Cephalosporins    AVOID:  E-Mycins      Kwell  Keflex  Macrobid/Macrodantin   Diarrhea:  Penicillin      Kao-Pectate  Zithromax      Imodium AD         PUSH FLUIDS AVOID:       Cipro     Fever:  Tetracycline      Tylenol (Regular or Extra  Minocycline       Strength)  Levaquin      Extra Strength-Do not          Exceed 8 tabs/24 hrs Caffeine:        <200mg/day (equiv. To 1 cup of coffee or  approx. 3 12 oz sodas)         Gas: Cold/Hayfever:       Gas-X  Benadryl      Mylicon  Claritin       Phazyme  **Claritin-D        Chlor-Trimeton    Headaches:  Dimetapp      ASA-Free Excedrin  Drixoral-Non-Drowsy     Cold Compress  Mucinex (Guaifenasin)     Tylenol (Regular or Extra  Sudafed/Sudafed-12 Hour     Strength)  **Sudafed PE Pseudoephedrine   Tylenol Cold & Sinus     Vicks Vapor Rub  Zyrtec  **AVOID if Problems With Blood Pressure         Heartburn: Avoid lying down for at least 1 hour after meals  Aciphex      Maalox     Rash:  Milk of Magnesia     Benadryl    Mylanta       1% Hydrocortisone Cream  Pepcid  Pepcid Complete   Sleep Aids:  Prevacid      Ambien   Prilosec       Benadryl  Rolaids       Chamomile Tea  Tums (Limit 4/day)     Unisom  Zantac       Tylenol PM         Warm milk-add vanilla or  Hemorrhoids:       Sugar for taste  Anusol/Anusol H.C.  (RX: Analapram 2.5%)  Sugar Substitutes:  Hydrocortisone OTC     Ok in moderation  Preparation H      Tucks        Vaseline lotion applied to tissue with  wiping    Herpes:     Throat:  Acyclovir      Oragel  Famvir  Valtrex     Vaccines:         Flu Shot Leg Cramps:       *Gardasil  Benadryl      Hepatitis A         Hepatitis B Nasal Spray:         Pneumovax  Saline Nasal Spray     Polio Booster         Tetanus Nausea:       Tuberculosis test or PPD  Vitamin B6 25 mg TID   AVOID:    Dramamine      *Gardasil  Emetrol       Live Poliovirus  Ginger Root 250 mg QID    MMR (measles, mumps &  High Complex Carbs @ Bedtime    rebella)  Sea Bands-Accupressure    Varicella (Chickenpox)  Unisom 1/2 tab TID     *No known complications           If received before Pain:         Known pregnancy;   Darvocet       Resume series after  Lortab        Delivery  Percocet    Yeast:   Tramadol      Femstat  Tylenol 3      Gyne-lotrimin  Ultram       Monistat  Vicodin           MISC:         All Sunscreens           Hair Coloring/highlights          Insect Repellant's          (Including DEET)         Mystic Tans Hemorrhoids Hemorrhoids are swollen veins in and around the rectum or anus. There are two types of hemorrhoids:  Internal hemorrhoids. These occur in the veins that are just inside the rectum. They may poke through to the outside and become irritated and painful.  External hemorrhoids. These occur in the veins that are outside of the anus and can be felt as a painful swelling or hard lump near the anus.  Most hemorrhoids do not cause serious problems, and they can be managed with home treatments such as diet and lifestyle changes. If home treatments do not help your symptoms, procedures can be done to shrink or remove the hemorrhoids. What are the causes? This condition is caused by increased pressure in the anal area. This pressure may result from various things, including:  Constipation.  Straining to have a bowel movement.  Diarrhea.  Pregnancy.  Obesity.  Sitting for long periods of time.  Heavy lifting or other activity  that causes you to strain.  Anal sex.  What are the signs or symptoms? Symptoms of this condition include:  Pain.  Anal itching or irritation.  Rectal bleeding.  Leakage of stool (feces).  Anal swelling.  One or more lumps around the anus.  How is this diagnosed? This condition can often be diagnosed through a visual exam. Other exams or tests may also be done, such as:  Examination of the rectal area with a gloved hand (digital rectal exam).  Examination of the anal canal using a small tube (anoscope).  A blood test, if you have lost a significant amount of blood.  A test to look inside the colon (sigmoidoscopy or colonoscopy).  How is this treated? This condition can usually be treated at home. However, various procedures may be done if dietary changes, lifestyle changes, and other home treatments do not help your symptoms. These procedures can help make the hemorrhoids smaller or remove them completely. Some of these procedures involve surgery, and others do not. Common procedures include:  Rubber band ligation. Rubber bands are placed at the  base of the hemorrhoids to cut off the blood supply to them.  Sclerotherapy. Medicine is injected into the hemorrhoids to shrink them.  Infrared coagulation. A type of light energy is used to get rid of the hemorrhoids.  Hemorrhoidectomy surgery. The hemorrhoids are surgically removed, and the veins that supply them are tied off.  Stapled hemorrhoidopexy surgery. A circular stapling device is used to remove the hemorrhoids and use staples to cut off the blood supply to them.  Follow these instructions at home: Eating and drinking  Eat foods that have a lot of fiber in them, such as whole grains, beans, nuts, fruits, and vegetables. Ask your health care provider about taking products that have added fiber (fiber supplements).  Drink enough fluid to keep your urine clear or pale yellow. Managing pain and swelling  Take warm  sitz baths for 20 minutes, 3-4 times a day to ease pain and discomfort.  If directed, apply ice to the affected area. Using ice packs between sitz baths may be helpful. ? Put ice in a plastic bag. ? Place a towel between your skin and the bag. ? Leave the ice on for 20 minutes, 2-3 times a day. General instructions  Take over-the-counter and prescription medicines only as told by your health care provider.  Use medicated creams or suppositories as told.  Exercise regularly.  Go to the bathroom when you have the urge to have a bowel movement. Do not wait.  Avoid straining to have bowel movements.  Keep the anal area dry and clean. Use wet toilet paper or moist towelettes after a bowel movement.  Do not sit on the toilet for long periods of time. This increases blood pooling and pain. Contact a health care provider if:  You have increasing pain and swelling that are not controlled by treatment or medicine.  You have uncontrolled bleeding.  You have difficulty having a bowel movement, or you are unable to have a bowel movement.  You have pain or inflammation outside the area of the hemorrhoids. This information is not intended to replace advice given to you by your health care provider. Make sure you discuss any questions you have with your health care provider. Document Released: 03/26/2000 Document Revised: 08/27/2015 Document Reviewed: 12/11/2014 Elsevier Interactive Patient Education  2017 [employer].

## 2016-09-10 NOTE — Progress Notes (Signed)
Pt is here for a regular OB visit. States is having contractions.

## 2016-09-12 NOTE — Progress Notes (Signed)
ROB-Pt doing well, ready to have baby. SVE unchanged since last visit. Taking HSV prophylaxis. Discussed labor preparation techniques including evening primrose oil, use of birth ball, pedicure, and exercise. Reviewed red flag symptoms and when to call. RTC x 1 week for ROB or sooner if needed.

## 2016-09-17 ENCOUNTER — Other Ambulatory Visit: Payer: Self-pay | Admitting: Certified Nurse Midwife

## 2016-09-17 ENCOUNTER — Ambulatory Visit (INDEPENDENT_AMBULATORY_CARE_PROVIDER_SITE_OTHER): Payer: Medicaid Other | Admitting: Certified Nurse Midwife

## 2016-09-17 ENCOUNTER — Encounter: Payer: Self-pay | Admitting: Certified Nurse Midwife

## 2016-09-17 VITALS — BP 110/65 | HR 86 | Wt 178.0 lb

## 2016-09-17 DIAGNOSIS — O480 Post-term pregnancy: Secondary | ICD-10-CM

## 2016-09-17 DIAGNOSIS — Z3493 Encounter for supervision of normal pregnancy, unspecified, third trimester: Secondary | ICD-10-CM

## 2016-09-17 LAB — POCT URINALYSIS DIPSTICK
Glucose, UA: NEGATIVE
Ketones, UA: NEGATIVE
Nitrite, UA: NEGATIVE
PH UA: 6 (ref 5.0–8.0)
RBC UA: NEGATIVE
Spec Grav, UA: 1.02 (ref 1.010–1.025)
UROBILINOGEN UA: 0.2 U/dL

## 2016-09-17 NOTE — Progress Notes (Signed)
ROB- last few days feels flushed with h/a- tylenol helps.

## 2016-09-17 NOTE — Progress Notes (Signed)
ROB, doing well. Has irregular contractions. Denies LOF, vag bleeding. Labor precautions reviewed. NST, BPP and ROB next visit for 40 + wks.   Doreene BurkeAnnie Neilah Fulwider, CNM

## 2016-09-17 NOTE — Patient Instructions (Signed)

## 2016-09-21 ENCOUNTER — Inpatient Hospital Stay
Admission: EM | Admit: 2016-09-21 | Discharge: 2016-09-23 | DRG: 775 | Disposition: A | Discharge status: Home / Self Care | Payer: Medicaid Other | Attending: Obstetrics and Gynecology | Admitting: Obstetrics and Gynecology

## 2016-09-21 DIAGNOSIS — Z3493 Encounter for supervision of normal pregnancy, unspecified, third trimester: Secondary | ICD-10-CM | POA: Diagnosis present

## 2016-09-21 DIAGNOSIS — Z3A40 40 weeks gestation of pregnancy: Secondary | ICD-10-CM

## 2016-09-21 DIAGNOSIS — IMO0002 Reserved for concepts with insufficient information to code with codable children: Secondary | ICD-10-CM | POA: Diagnosis present

## 2016-09-21 LAB — CBC
HCT: 37 % (ref 35.0–47.0)
HEMOGLOBIN: 12.6 g/dL (ref 12.0–16.0)
MCH: 28.5 pg (ref 26.0–34.0)
MCHC: 34.2 g/dL (ref 32.0–36.0)
MCV: 83.5 fL (ref 80.0–100.0)
Platelets: 200 10*3/uL (ref 150–440)
RBC: 4.43 MIL/uL (ref 3.80–5.20)
RDW: 13 % (ref 11.5–14.5)
WBC: 8.8 10*3/uL (ref 3.6–11.0)

## 2016-09-21 MED ORDER — ACETAMINOPHEN 325 MG PO TABS: 650.0000 mg | ORAL_TABLET | ORAL | Status: DC | PRN

## 2016-09-21 MED ORDER — OXYTOCIN 40 UNITS IN LACTATED RINGERS INFUSION - SIMPLE MED: 2.5000 [IU]/h | INTRAVENOUS | Status: DC

## 2016-09-21 MED ORDER — LIDOCAINE HCL (PF) 1 % IJ SOLN: 30.0000 mL | INTRAMUSCULAR | Status: DC | PRN

## 2016-09-21 MED ORDER — AMMONIA AROMATIC IN INHA
RESPIRATORY_TRACT | Status: AC
  Filled 2016-09-21: qty 10

## 2016-09-21 MED ORDER — SOD CITRATE-CITRIC ACID 500-334 MG/5ML PO SOLN
30.0000 mL | ORAL | Status: DC | PRN
  Filled 2016-09-21: qty 30

## 2016-09-21 MED ORDER — ONDANSETRON HCL 4 MG/2ML IJ SOLN: 4.0000 mg | Freq: Four times a day (QID) | INTRAMUSCULAR | Status: DC | PRN

## 2016-09-21 MED ORDER — MISOPROSTOL 200 MCG PO TABS
ORAL_TABLET | ORAL | Status: AC
  Filled 2016-09-21: qty 4

## 2016-09-21 MED ORDER — OXYTOCIN 40 UNITS IN LACTATED RINGERS INFUSION - SIMPLE MED
INTRAVENOUS | Status: AC
  Administered 2016-09-22: 500 mL via INTRAVENOUS
  Filled 2016-09-21: qty 1000

## 2016-09-21 MED ORDER — OXYTOCIN 10 UNIT/ML IJ SOLN
INTRAMUSCULAR | Status: AC
  Filled 2016-09-21: qty 2

## 2016-09-21 MED ORDER — LACTATED RINGERS IV SOLN
500.0000 mL | INTRAVENOUS | Status: DC | PRN
  Administered 2016-09-22: 500 mL via INTRAVENOUS

## 2016-09-21 MED ORDER — FENTANYL CITRATE (PF) 100 MCG/2ML IJ SOLN
50.0000 ug | INTRAMUSCULAR | Status: DC | PRN
  Administered 2016-09-22: 100 ug via INTRAVENOUS
  Filled 2016-09-21: qty 2

## 2016-09-21 MED ORDER — LACTATED RINGERS IV SOLN
INTRAVENOUS | Status: DC
  Administered 2016-09-22: via INTRAVENOUS

## 2016-09-21 MED ORDER — ACYCLOVIR 200 MG PO CAPS
400.0000 mg | ORAL_CAPSULE | Freq: Two times a day (BID) | ORAL | Status: DC
  Administered 2016-09-22 – 2016-09-23 (×3): 400 mg via ORAL
  Filled 2016-09-21 (×5): qty 2

## 2016-09-21 MED ORDER — LIDOCAINE HCL (PF) 1 % IJ SOLN
INTRAMUSCULAR | Status: AC
  Filled 2016-09-21: qty 30

## 2016-09-21 MED ORDER — OXYTOCIN BOLUS FROM INFUSION
500.0000 mL | Freq: Once | INTRAVENOUS | Status: AC
  Administered 2016-09-22: 500 mL via INTRAVENOUS

## 2016-09-21 NOTE — OB Triage Note (Signed)
Patient stated her water broke at 2140 as she was coming to the hospital. Patient states her contractions started about the same time, denies decreased fetal movement but has been having some bloody show

## 2016-09-21 NOTE — H&P (Signed)
Angela Fox is a 31 y.o. female presenting for SROM with clear fluid. History OB History    Gravida Para Term Preterm AB Living   2 1 1     1    SAB TAB Ectopic Multiple Live Births           1     Past Medical History:  Diagnosis Date  . Abnormal Pap smear of cervix   . Amenorrhea   . Endometriosis    Past Surgical History:  Procedure Laterality Date  . CERVICAL BIOPSY  W/ LOOP ELECTRODE EXCISION     pt was 15-29yo  . LEEP     Family History: family history includes Cancer in her mother; Migraines in her mother; Rheum arthritis in her maternal grandfather and maternal grandmother; Seizures in her mother; Stroke in her mother. Social History:  reports that she has never smoked. She has never used smokeless tobacco. She reports that she does not drink alcohol or use drugs.   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 rate 18, height 5\' 11"  (1.803 m), weight 178 lb (80.7 kg), last menstrual period 11/11/2015. Exam Physical Exam  Prenatal labs: ABO, Rh: --/--/B POS (10/23 1107) Antibody: Negative (10/30 1657) Rubella: 1.35 (10/30 1657) RPR: Non Reactive (10/30 1657)  HBsAg: Negative (10/30 1657)  HIV: Non Reactive (10/30 1657)  GBS:   negative  Assessment/Plan: SROM at 40+ weeks, labor per expectant management.   Melody N Shambley 09/21/2016, 11:18 PM

## 2016-09-22 ENCOUNTER — Inpatient Hospital Stay: Payer: Medicaid Other | Admitting: Anesthesiology

## 2016-09-22 DIAGNOSIS — Z3A40 40 weeks gestation of pregnancy: Secondary | ICD-10-CM

## 2016-09-22 LAB — TYPE AND SCREEN
ABO/RH(D): B POS
ANTIBODY SCREEN: NEGATIVE

## 2016-09-22 MED ORDER — SENNOSIDES-DOCUSATE SODIUM 8.6-50 MG PO TABS
2.0000 | ORAL_TABLET | ORAL | Status: DC
  Administered 2016-09-22: 2 via ORAL
  Filled 2016-09-22: qty 2

## 2016-09-22 MED ORDER — EPHEDRINE 5 MG/ML INJ
10.0000 mg | INTRAVENOUS | Status: DC | PRN
  Filled 2016-09-22: qty 2

## 2016-09-22 MED ORDER — ONDANSETRON HCL 4 MG/2ML IJ SOLN: 4.0000 mg | INTRAMUSCULAR | Status: DC | PRN

## 2016-09-22 MED ORDER — BENZOCAINE-MENTHOL 20-0.5 % EX AERO
1.0000 "application " | INHALATION_SPRAY | CUTANEOUS | Status: DC | PRN
  Filled 2016-09-22: qty 56

## 2016-09-22 MED ORDER — SIMETHICONE 80 MG PO CHEW: 80.0000 mg | CHEWABLE_TABLET | ORAL | Status: DC | PRN

## 2016-09-22 MED ORDER — FENTANYL 2.5 MCG/ML W/ROPIVACAINE 0.2% IN NS 100 ML EPIDURAL INFUSION (ARMC-ANES)
EPIDURAL | Status: AC
  Filled 2016-09-22: qty 100

## 2016-09-22 MED ORDER — ACETAMINOPHEN 325 MG PO TABS: 650.0000 mg | ORAL_TABLET | ORAL | Status: DC | PRN

## 2016-09-22 MED ORDER — PRENATAL MULTIVITAMIN CH
1.0000 | ORAL_TABLET | Freq: Every day | ORAL | Status: DC
  Administered 2016-09-22 – 2016-09-23 (×2): 1 via ORAL
  Filled 2016-09-22 (×3): qty 1

## 2016-09-22 MED ORDER — DIPHENHYDRAMINE HCL 50 MG/ML IJ SOLN: 12.5000 mg | INTRAMUSCULAR | Status: DC | PRN

## 2016-09-22 MED ORDER — FENTANYL 2.5 MCG/ML W/ROPIVACAINE 0.15% IN NS 100 ML EPIDURAL (ARMC)
12.0000 mL/h | EPIDURAL | Status: DC
  Administered 2016-09-22: 12 mL/h via EPIDURAL

## 2016-09-22 MED ORDER — IBUPROFEN 600 MG PO TABS
600.0000 mg | ORAL_TABLET | Freq: Four times a day (QID) | ORAL | Status: DC
  Administered 2016-09-22 – 2016-09-23 (×6): 600 mg via ORAL
  Filled 2016-09-22 (×6): qty 1

## 2016-09-22 MED ORDER — DIPHENHYDRAMINE HCL 25 MG PO CAPS: 25.0000 mg | ORAL_CAPSULE | Freq: Four times a day (QID) | ORAL | Status: DC | PRN

## 2016-09-22 MED ORDER — LACTATED RINGERS IV SOLN: 500.0000 mL | Freq: Once | INTRAVENOUS | Status: DC

## 2016-09-22 MED ORDER — DIBUCAINE 1 % RE OINT: 1.0000 "application " | TOPICAL_OINTMENT | RECTAL | Status: DC | PRN

## 2016-09-22 MED ORDER — DOCUSATE SODIUM 100 MG PO CAPS
100.0000 mg | ORAL_CAPSULE | Freq: Two times a day (BID) | ORAL | Status: DC
  Administered 2016-09-22 – 2016-09-23 (×2): 100 mg via ORAL
  Filled 2016-09-22 (×2): qty 1

## 2016-09-22 MED ORDER — SODIUM CHLORIDE 0.9 % IV SOLN
INTRAVENOUS | Status: DC | PRN
  Administered 2016-09-22 (×3): 5 mL via EPIDURAL

## 2016-09-22 MED ORDER — ONDANSETRON HCL 4 MG PO TABS: 4.0000 mg | ORAL_TABLET | ORAL | Status: DC | PRN

## 2016-09-22 MED ORDER — LIDOCAINE-EPINEPHRINE (PF) 1.5 %-1:200000 IJ SOLN
INTRAMUSCULAR | Status: DC | PRN
  Administered 2016-09-22: 3 mL

## 2016-09-22 MED ORDER — WITCH HAZEL-GLYCERIN EX PADS: 1.0000 "application " | MEDICATED_PAD | CUTANEOUS | Status: DC | PRN

## 2016-09-22 MED ORDER — COCONUT OIL OIL: 1.0000 "application " | TOPICAL_OIL | Status: DC | PRN

## 2016-09-22 MED ORDER — PHENYLEPHRINE 40 MCG/ML (10ML) SYRINGE FOR IV PUSH (FOR BLOOD PRESSURE SUPPORT)
80.0000 ug | PREFILLED_SYRINGE | INTRAVENOUS | Status: DC | PRN
  Filled 2016-09-22: qty 5

## 2016-09-22 NOTE — Anesthesia Preprocedure Evaluation (Signed)
Anesthesia Evaluation  Patient identified by MRN, date of birth, ID band Patient awake    Reviewed: Allergy & Precautions, NPO status , Patient's Chart, lab work & pertinent test results  History of Anesthesia Complications Negative for: history of anesthetic complications  Airway Mallampati: II  TM Distance: >3 FB Neck ROM: Full    Dental no notable dental hx.    Pulmonary neg pulmonary ROS, neg sleep apnea, neg COPD,    breath sounds clear to auscultation- rhonchi (-) wheezing      Cardiovascular Exercise Tolerance: Good (-) hypertension(-) CAD and (-) Past MI  Rhythm:Regular Rate:Normal - Systolic murmurs and - Diastolic murmurs    Neuro/Psych negative neurological ROS  negative psych ROS   GI/Hepatic negative GI ROS, Neg liver ROS,   Endo/Other  negative endocrine ROSneg diabetes  Renal/GU negative Renal ROS     Musculoskeletal negative musculoskeletal ROS (+)   Abdominal Gravid abdomen   Peds  Hematology negative hematology ROS (+)   Anesthesia Other Findings   Reproductive/Obstetrics (+) Pregnancy                             Anesthesia Physical Anesthesia Plan  ASA: II  Anesthesia Plan: Epidural   Post-op Pain Management:    Induction:   PONV Risk Score and Plan: 2  Airway Management Planned:   Additional Equipment:   Intra-op Plan:   Post-operative Plan:   Informed Consent: I have reviewed the patients History and Physical, chart, labs and discussed the procedure including the risks, benefits and alternatives for the proposed anesthesia with the patient or authorized representative who has indicated his/her understanding and acceptance.     Plan Discussed with: Anesthesiologist  Anesthesia Plan Comments: (Plan for epidural for labor, discussed epidural vs spinal vs GA if need for csection)        Lab Results  Component Value Date   WBC 8.8 09/21/2016   HGB 12.6 09/21/2016   HCT 37.0 09/21/2016   MCV 83.5 09/21/2016   PLT 200 09/21/2016    Anesthesia Quick Evaluation

## 2016-09-22 NOTE — Anesthesia Procedure Notes (Signed)
Epidural Patient location during procedure: OB Start time: 09/22/2016 12:12 AM End time: 09/22/2016 12:31 AM  Staffing Anesthesiologist: Priscella MannPENWARDEN, Amandeep Hogston Performed: anesthesiologist   Preanesthetic Checklist Completed: patient identified, site marked, surgical consent, pre-op evaluation, timeout performed, IV checked, risks and benefits discussed and monitors and equipment checked  Epidural Patient position: sitting Prep: ChloraPrep Patient monitoring: heart rate, continuous pulse ox and blood pressure Approach: midline Location: L3-L4 Injection technique: LOR saline  Needle:  Needle type: Tuohy  Needle gauge: 18 G Needle length: 9 cm and 9 Needle insertion depth: 5 cm Catheter type: closed end flexible Catheter size: 20 Guage Catheter at skin depth: 9 cm Test dose: negative (0.125% bupivacaine)  Assessment Events: blood not aspirated, injection not painful, no injection resistance, negative IV test and no paresthesia  Additional Notes   Patient tolerated the insertion well without complications.Reason for block:procedure for pain

## 2016-09-23 LAB — CBC
HCT: 34.3 % — ABNORMAL LOW (ref 35.0–47.0)
Hemoglobin: 11.8 g/dL — ABNORMAL LOW (ref 12.0–16.0)
MCH: 29 pg (ref 26.0–34.0)
MCHC: 34.2 g/dL (ref 32.0–36.0)
MCV: 84.7 fL (ref 80.0–100.0)
PLATELETS: 155 10*3/uL (ref 150–440)
RBC: 4.06 MIL/uL (ref 3.80–5.20)
RDW: 13 % (ref 11.5–14.5)
WBC: 8.9 10*3/uL (ref 3.6–11.0)

## 2016-09-23 LAB — RPR: RPR Ser Ql: NONREACTIVE

## 2016-09-23 MED ORDER — VITAMIN D3 125 MCG (5000 UT) PO CAPS: 1.0000 | ORAL_CAPSULE | Freq: Every day | ORAL | 2 refills | Status: DC

## 2016-09-23 MED ORDER — MEDROXYPROGESTERONE ACETATE 150 MG/ML IM SUSP: 150.0000 mg | INTRAMUSCULAR | 4 refills | Status: DC

## 2016-09-23 MED ORDER — DOCUSATE SODIUM 100 MG PO CAPS: 100.0000 mg | ORAL_CAPSULE | Freq: Every day | ORAL | 1 refills | Status: DC

## 2016-09-23 NOTE — Anesthesia Postprocedure Evaluation (Signed)
Anesthesia Post Note  Patient: Angela RougeMegan E Fox  Procedure(s) Performed: * No procedures listed *  Patient location during evaluation: Mother Baby Anesthesia Type: Epidural Level of consciousness: awake and alert Pain management: pain level controlled Vital Signs Assessment: post-procedure vital signs reviewed and stable Respiratory status: spontaneous breathing, nonlabored ventilation and respiratory function stable Cardiovascular status: stable Postop Assessment: no headache, no backache and epidural receding Anesthetic complications: no     Last Vitals:  Vitals:   09/23/16 0435 09/23/16 0820  BP: 108/74 114/83  Pulse: 80 68  Resp: 18 20  Temp: 36.6 C 36.7 C    Last Pain:  Vitals:   09/23/16 0820  TempSrc: Oral  PainSc:                  [name],  [name]

## 2016-09-23 NOTE — Progress Notes (Signed)
Patient discharged home with infant and family. Discharge instructions, prescriptions and follow up appointment given to and reviewed with patient and family. Patient verbalized understanding. Escorted out via wheelchair by auxiliary.  

## 2016-09-23 NOTE — Discharge Summary (Signed)
Physician Obstetric Discharge Summary  Patient ID: Angela Fox MRN: [id number] DOB/AGE: 290/08/1985 31 y.o.   Date of Admission: 09/21/2016  Date of Discharge:   Admitting Diagnosis: Onset of Labor at 647w5d  Secondary Diagnosis: none  Mode of Delivery: normal spontaneous vaginal delivery     Discharge Diagnosis: SROM with SVD   Intrapartum Procedures: none   Post partum procedures: none  Complications: none   Brief Hospital Course  Angela Fox is a G2P1001 who had a SVD on 09/22/16;  for further details of this surgery, please refer to the delivey note.  Patient had an uncomplicated postpartum course.  By time of discharge on PPD#1, her pain was controlled on oral pain medications; she had appropriate lochia and was ambulating, voiding without difficulty and tolerating regular diet.  She was deemed stable for discharge to home.     Labs: CBC Latest Ref Rng & Units 09/23/2016 09/21/2016 06/25/2016  WBC 3.6 - 11.0 K/uL 8.9 8.8 -  Hemoglobin 12.0 - 16.0 g/dL 11.8(L) 12.6 12.3  Hematocrit 35.0 - 47.0 % 34.3(L) 37.0 36.9  Platelets 150 - 440 K/uL 155 200 -   B POS  Physical exam:  Blood pressure 108/74, pulse 80, temperature 97.9 F (36.6 C), temperature source Oral, resp. rate 18, height 5\' 11"  (1.803 m), weight 178 lb (80.7 kg), last menstrual period 11/11/2015, SpO2 99 %. General: alert and no distress Lochia: appropriate Abdomen: soft, NT Uterine Fundus: firm Extremities: No evidence of DVT seen on physical exam. No lower extremity edema.  Discharge Instructions: Per After Visit Summary. Activity: Advance as tolerated. Pelvic rest for 6 weeks.  Also refer to After Visit Summary Diet: Regular Medications: Allergies as of 09/23/2016   No Known Allergies     Medication List    TAKE these medications   docusate sodium 100 MG capsule Commonly known as:  COLACE Take 1 capsule (100 mg total) by mouth daily.   medroxyPROGESTERone 150 MG/ML injection Commonly known  as:  DEPO-PROVERA Inject 1 mL (150 mg total) into the muscle every 3 (three) months.   multivitamin-prenatal 27-0.8 MG Tabs tablet Take 1 tablet by mouth daily at 12 noon.   valACYclovir 500 MG tablet Commonly known as:  VALTREX Take 1 tablet (500 mg total) by mouth 2 (two) times daily.   Vitamin D3 5000 units Caps Take 1 capsule (5,000 Units total) by mouth daily.      Outpatient follow up:  Postpartum contraception: Depo-Provera-to bring to office at 4 weeks  Discharged Condition: good  Discharged to: home   Newborn Data: Disposition:home with mother "Samson Fredericlla"  Apgars: APGAR (1 MIN): 8   APGAR (5 MINS): 9   APGAR (10 MINS):    Baby Feeding: Breast  Jaycub Noorani Suzan NailerN Bryden Darden, CNM

## 2016-09-23 NOTE — Progress Notes (Signed)
Post Partum Day 1 Subjective: no complaints, up ad lib and voiding  Objective: Blood pressure 108/74, pulse 80, temperature 97.9 F (36.6 C), temperature source Oral, resp. rate 18, height 5\' 11"  (1.803 m), weight 178 lb (80.7 kg), last menstrual period 11/11/2015, SpO2 99 %.  Physical Exam:  General: alert, cooperative and appears stated age Lochia: appropriate Uterine Fundus: firm Incision: NA DVT Evaluation: No evidence of DVT seen on physical exam. Negative Homan's sign.   Recent Labs  09/21/16 2248 09/23/16 0350  HGB 12.6 11.8*  HCT 37.0 34.3*    Assessment/Plan: Discharge home and Breastfeeding   LOS: 2 days   Angela Fox 09/23/2016, 8:09 AM

## 2016-09-24 ENCOUNTER — Other Ambulatory Visit: Payer: Medicaid Other | Admitting: Obstetrics and Gynecology

## 2016-09-24 ENCOUNTER — Other Ambulatory Visit: Payer: Medicaid Other

## 2018-01-30 LAB — HM PAP SMEAR: HM Pap smear: NEGATIVE

## 2018-01-30 LAB — HM HIV SCREENING LAB: HM HIV Screening: NEGATIVE

## 2018-02-27 DIAGNOSIS — F321 Major depressive disorder, single episode, moderate: Secondary | ICD-10-CM

## 2018-02-27 DIAGNOSIS — F411 Generalized anxiety disorder: Secondary | ICD-10-CM | POA: Insufficient documentation

## 2018-02-27 HISTORY — DX: Generalized anxiety disorder: F41.1

## 2018-02-27 HISTORY — DX: Major depressive disorder, single episode, moderate: F32.1

## 2018-11-13 DIAGNOSIS — F321 Major depressive disorder, single episode, moderate: Secondary | ICD-10-CM

## 2018-11-13 DIAGNOSIS — F411 Generalized anxiety disorder: Secondary | ICD-10-CM

## 2019-02-16 ENCOUNTER — Other Ambulatory Visit: Payer: Self-pay

## 2019-02-16 ENCOUNTER — Emergency Department: Payer: Medicaid Other

## 2019-02-16 ENCOUNTER — Emergency Department
Admission: EM | Admit: 2019-02-16 | Discharge: 2019-02-16 | Disposition: A | Discharge status: Home / Self Care | Payer: Medicaid Other | Attending: Emergency Medicine | Admitting: Emergency Medicine

## 2019-02-16 ENCOUNTER — Encounter: Payer: Self-pay | Admitting: Emergency Medicine

## 2019-02-16 DIAGNOSIS — S63621A Sprain of interphalangeal joint of right thumb, initial encounter: Secondary | ICD-10-CM | POA: Insufficient documentation

## 2019-02-16 DIAGNOSIS — Z79899 Other long term (current) drug therapy: Secondary | ICD-10-CM | POA: Diagnosis not present

## 2019-02-16 DIAGNOSIS — Y93I9 Activity, other involving external motion: Secondary | ICD-10-CM | POA: Insufficient documentation

## 2019-02-16 DIAGNOSIS — Y999 Unspecified external cause status: Secondary | ICD-10-CM | POA: Diagnosis not present

## 2019-02-16 DIAGNOSIS — R519 Headache, unspecified: Secondary | ICD-10-CM | POA: Insufficient documentation

## 2019-02-16 DIAGNOSIS — M7918 Myalgia, other site: Secondary | ICD-10-CM

## 2019-02-16 DIAGNOSIS — Y92410 Unspecified street and highway as the place of occurrence of the external cause: Secondary | ICD-10-CM | POA: Diagnosis not present

## 2019-02-16 DIAGNOSIS — S6991XA Unspecified injury of right wrist, hand and finger(s), initial encounter: Secondary | ICD-10-CM | POA: Diagnosis present

## 2019-02-16 MED ORDER — CYCLOBENZAPRINE HCL 10 MG PO TABS: 10.0000 mg | ORAL_TABLET | Freq: Three times a day (TID) | ORAL | 0 refills | Status: DC | PRN

## 2019-02-16 MED ORDER — IBUPROFEN 600 MG PO TABS: 600.0000 mg | ORAL_TABLET | Freq: Three times a day (TID) | ORAL | 0 refills | Status: DC | PRN

## 2019-02-16 MED ORDER — TRAMADOL HCL 50 MG PO TABS: 50.0000 mg | ORAL_TABLET | Freq: Four times a day (QID) | ORAL | 0 refills | Status: DC | PRN

## 2019-02-16 NOTE — Discharge Instructions (Signed)
Follow discharge care instruction as directed.  With thumb splint for 3 to 5 days as needed.  Advise combination medication may cause drowsiness.
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
## 2019-03-19 ENCOUNTER — Encounter: Payer: Self-pay | Admitting: Certified Nurse Midwife

## 2019-03-20 ENCOUNTER — Other Ambulatory Visit: Payer: Self-pay

## 2019-03-20 ENCOUNTER — Encounter: Payer: Self-pay | Admitting: Certified Nurse Midwife

## 2019-03-20 ENCOUNTER — Ambulatory Visit (INDEPENDENT_AMBULATORY_CARE_PROVIDER_SITE_OTHER): Payer: Medicaid Other | Admitting: Certified Nurse Midwife

## 2019-03-20 VITALS — BP 108/74 | HR 94 | Ht 71.0 in | Wt 166.6 lb

## 2019-03-20 DIAGNOSIS — N926 Irregular menstruation, unspecified: Secondary | ICD-10-CM

## 2019-03-20 DIAGNOSIS — Z20822 Contact with and (suspected) exposure to covid-19: Secondary | ICD-10-CM

## 2019-03-20 MED ORDER — MEDROXYPROGESTERONE ACETATE 150 MG/ML IM SUSP: 150.0000 mg | Freq: Once | INTRAMUSCULAR | 3 refills | Status: DC

## 2019-03-20 NOTE — Progress Notes (Signed)
GYN ENCOUNTER NOTE  Subjective:       Angela Fox is a 33 y.o. G48P1001 female is here for gynecologic evaluation of the following issues:  1.irregular periods. She states she did not start [facility] after delivery and got pregnant again, Then had a miscarrage. She went to health department and started on Depo injections. Then Covid happened and she was not able to get next dose . She has not had a period since April or May. She has history of endometriosis. State she has had some spotting of brown blood and had symptoms of period but has not had one.    Gynecologic History No LMP recorded. (Menstrual status: Irregular Periods). Contraception: none Last Pap: unsure. Done at health department. States she will find out.  Last mammogram: n/a   Obstetric History OB History  Gravida Para Term Preterm AB Living  2 1 1     1   SAB TAB Ectopic Multiple Live Births          1    # Outcome Date GA Lbr Len/2nd Weight Sex Delivery Anes PTL Lv  2 Gravida           1 Term 09/2007 [redacted]w[redacted]d  6 lb 6 oz (2.892 kg) M Vag-Spont  N LIV    Past Medical History:  Diagnosis Date  . Abnormal Pap smear of cervix   . Amenorrhea   . Endometriosis     Past Surgical History:  Procedure Laterality Date  . CERVICAL BIOPSY  W/ LOOP ELECTRODE EXCISION     pt was 15-16yo  . LEEP      Current Outpatient Medications on File Prior to Visit  Medication Sig Dispense Refill  . Cholecalciferol (VITAMIN D3) 5000 units CAPS Take 1 capsule (5,000 Units total) by mouth daily. (Patient not taking: Reported on 03/20/2019) 120 capsule 2  . cyclobenzaprine (FLEXERIL) 10 MG tablet Take 1 tablet (10 mg total) by mouth 3 (three) times daily as needed. (Patient not taking: Reported on 03/20/2019) 15 tablet 0  . ibuprofen (ADVIL) 600 MG tablet Take 1 tablet (600 mg total) by mouth every 8 (eight) hours as needed. (Patient not taking: Reported on 03/20/2019) 15 tablet 0  . medroxyPROGESTERone (DEPO-PROVERA) 150 MG/ML injection Inject 1 mL  (150 mg total) into the muscle every 3 (three) months. (Patient not taking: Reported on 03/20/2019) 1 mL 4  . traMADol (ULTRAM) 50 MG tablet Take 1 tablet (50 mg total) by mouth every 6 (six) hours as needed for moderate pain. (Patient not taking: Reported on 03/20/2019) 12 tablet 0  . valACYclovir (VALTREX) 500 MG tablet Take 1 tablet (500 mg total) by mouth 2 (two) times daily. (Patient not taking: Reported on 03/20/2019) 60 tablet 4   No current facility-administered medications on file prior to visit.     No Known Allergies  Social History   Socioeconomic History  . Marital status: Single    Spouse name: Not on file  . Number of children: Not on file  . Years of education: Not on file  . Highest education level: Not on file  Occupational History  . 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 Financial resource strain: Not on file  . Food insecurity    Worry: Not on file    Inability: Not on file  . Transportation needs    Medical: Not on file    Non-medical: Not on file  Tobacco Use  . Smoking status: Never Smoker  .  Smokeless tobacco: Never Used  Substance and Sexual Activity  . Alcohol use: No  . Drug use: No  . Sexual activity: Not Currently    Partners: Male    Birth control/protection: None  Lifestyle  . Physical activity    Days per week: Not on file    Minutes per session: Not on file  . Stress: Not on file  Relationships  . Social Herbalist on phone: Not on file    Gets together: Not on file    Attends religious service: Not on file    Active member of club or organization: Not on file    Attends meetings of clubs or organizations: Not on file    Relationship status: Not on file  . Intimate partner violence    Fear of current or ex partner: Not on file    Emotionally abused: Not on file    Physically abused: Not on file    Forced sexual activity: Not on file  Other Topics Concern  . Not on file  Social History  Narrative  . Not on file    Family History  Problem Relation Age of Onset  . Cancer Mother   . Migraines Mother   . Seizures Mother   . Stroke Mother   . Rheum arthritis Maternal Grandmother   . Rheum arthritis Maternal Grandfather   . Migraines Brother     The following portions of the patient's history were reviewed and updated as appropriate: allergies, current medications, past family history, past medical history, past social history, past surgical history and problem list.  Review of Systems Review of Systems - Negative except as mentioned in HPI Review of Systems - General ROS: negative for - chills, fatigue, fever, hot flashes, malaise or night sweats Hematological and Lymphatic ROS: negative for - bleeding problems or swollen lymph nodes Gastrointestinal ROS: negative for - abdominal pain, blood in stools, change in bowel habits and nausea/vomiting Musculoskeletal ROS: negative for - joint pain, muscle pain or muscular weakness Genito-Urinary ROS: negative for - change in menstrual cycle, dysmenorrhea, dyspareunia, dysuria, genital discharge, genital ulcers, hematuria, incontinence, heavy menses, nocturia or pelvic pain. Positive for irregular periods.   Objective:   BP 108/74   Pulse 94   Ht 5\' 11"  (1.803 m)   Wt 166 lb 9 oz (75.6 kg)   BMI 23.23 kg/m  CONSTITUTIONAL: Well-developed, well-nourished female in no acute distress.  HENT:  Normocephalic, atraumatic.  NECK: Normal range of motion, supple, no masses.  Normal thyroid.  SKIN: Skin is warm and dry. No rash noted. Not diaphoretic. No erythema. No pallor. [place]: Alert and oriented to person, place, and time. PSYCHIATRIC: Normal mood and affect. Normal behavior. Normal judgment and thought content. CARDIOVASCULAR:Not Examined RESPIRATORY: Not Examined BREASTS: Not Examined ABDOMEN: Soft, non distended; Non tender.  No Organomegaly. PELVIC: not indicated  MUSCULOSKELETAL: Normal range of motion. No  tenderness.  No cyanosis, clubbing, or edema.  Assessment:  Irregular menses  Plan:   Discussed irregular periods and possible causes including PCOS. Blood work and u/s ordered. Discussed treatment options of BC or provera challenge. PT request to start back on Depo injections. Explained that period can be irregular and may even become heavy /long inially with Depo. She verbalizes and agrees to plan of care.   I attest more than 50% of visit was spent reviewing history, discussing possible causes, discussing diagnosis, and treatment options. Face to face time 15 min.   Philip Aspen, CNM

## 2019-03-21 LAB — TESTOSTERONE: Testosterone: 4 ng/dL — ABNORMAL LOW (ref 8–48)

## 2019-03-21 LAB — PROLACTIN: Prolactin: 23.6 ng/mL — ABNORMAL HIGH (ref 4.8–23.3)

## 2019-03-21 LAB — TSH: TSH: 1.35 u[IU]/mL (ref 0.450–4.500)

## 2019-03-21 LAB — FSH/LH
FSH: 3.9 m[IU]/mL
LH: 8.2 m[IU]/mL

## 2019-03-22 LAB — NOVEL CORONAVIRUS, NAA: SARS-CoV-2, NAA: DETECTED — AB

## 2019-03-29 ENCOUNTER — Other Ambulatory Visit: Payer: Self-pay

## 2019-03-29 ENCOUNTER — Other Ambulatory Visit: Payer: Medicaid Other

## 2019-03-29 ENCOUNTER — Ambulatory Visit (INDEPENDENT_AMBULATORY_CARE_PROVIDER_SITE_OTHER): Payer: Medicaid Other

## 2019-03-29 ENCOUNTER — Ambulatory Visit (INDEPENDENT_AMBULATORY_CARE_PROVIDER_SITE_OTHER): Payer: Medicaid Other | Admitting: Certified Nurse Midwife

## 2019-03-29 ENCOUNTER — Ambulatory Visit: Payer: Medicaid Other

## 2019-03-29 VITALS — BP 103/75 | HR 89 | Ht 71.0 in | Wt 170.0 lb

## 2019-03-29 DIAGNOSIS — Z3042 Encounter for surveillance of injectable contraceptive: Secondary | ICD-10-CM | POA: Diagnosis not present

## 2019-03-29 DIAGNOSIS — Z3202 Encounter for pregnancy test, result negative: Secondary | ICD-10-CM | POA: Diagnosis not present

## 2019-03-29 DIAGNOSIS — N926 Irregular menstruation, unspecified: Secondary | ICD-10-CM

## 2019-03-29 LAB — POCT URINE PREGNANCY: Preg Test, Ur: NEGATIVE

## 2019-03-29 MED ORDER — MEDROXYPROGESTERONE ACETATE 150 MG/ML IM SUSP
150.0000 mg | Freq: Once | INTRAMUSCULAR | Status: AC
  Administered 2019-03-29: 150 mg via INTRAMUSCULAR

## 2019-03-29 NOTE — Progress Notes (Signed)
Date last pap: 03/02/2016 (Negative). Last Depo-Provera: "A while ago", restarting Depo. Side Effects if any: None per patient. Urine HCG indicated? Negative. Depo-Provera 150 mg IM given by: Jennye Moccasin, CMA. Next appointment due 3/4-3/18/2021.  BP 103/75   Pulse 89   Ht 5\' 11"  (1.803 m)   Wt 170 lb (77.1 kg)   BMI 23.71 kg/m

## 2019-05-08 ENCOUNTER — Other Ambulatory Visit: Payer: Self-pay

## 2019-05-08 MED ORDER — ACYCLOVIR 400 MG PO TABS: 400.0000 mg | ORAL_TABLET | Freq: Two times a day (BID) | ORAL | 3 refills | Status: DC

## 2019-06-14 NOTE — Progress Notes (Deleted)
Last depo inj: 03/29/19 UPT: N/A Side effects: none Next Depo- Provera injection due: 08/31/19-09/14/19 Annual exam due: 03/02/21

## 2019-06-15 ENCOUNTER — Ambulatory Visit: Payer: Medicaid Other

## 2019-06-18 ENCOUNTER — Other Ambulatory Visit: Payer: Self-pay

## 2019-06-18 ENCOUNTER — Ambulatory Visit: Payer: Medicaid Other

## 2019-06-19 ENCOUNTER — Ambulatory Visit (INDEPENDENT_AMBULATORY_CARE_PROVIDER_SITE_OTHER): Payer: Medicaid Other | Admitting: Certified Nurse Midwife

## 2019-06-19 VITALS — BP 106/79 | HR 95 | Ht 71.0 in | Wt 174.3 lb

## 2019-06-19 DIAGNOSIS — Z3042 Encounter for surveillance of injectable contraceptive: Secondary | ICD-10-CM | POA: Diagnosis not present

## 2019-06-19 MED ORDER — MEDROXYPROGESTERONE ACETATE 150 MG/ML IM SUSP
150.0000 mg | Freq: Once | INTRAMUSCULAR | Status: AC
  Administered 2019-06-19: 150 mg via INTRAMUSCULAR

## 2019-06-19 NOTE — Progress Notes (Signed)
Date last pap: 03/02/2016 (Negative). Last Depo-Provera: 03/29/2019. Side Effects if any: None per patient. Serum HCG indicated? N/A, patient is within dates. Depo-Provera 150 mg IM given by: Park Meo, CMA. Next appointment due 5/25-09/18/2019.  BP 106/79   Pulse 95   Ht 5\' 11"  (1.803 m)   Wt 174 lb 4.8 oz (79.1 kg)   LMP 06/19/2019 (Exact Date)   Breastfeeding No   BMI 24.31 kg/m

## 2019-09-11 ENCOUNTER — Ambulatory Visit: Payer: Medicaid Other

## 2019-09-13 ENCOUNTER — Ambulatory Visit (INDEPENDENT_AMBULATORY_CARE_PROVIDER_SITE_OTHER): Payer: Medicaid Other | Admitting: Surgical

## 2019-09-13 ENCOUNTER — Other Ambulatory Visit: Payer: Self-pay

## 2019-09-13 DIAGNOSIS — Z3042 Encounter for surveillance of injectable contraceptive: Secondary | ICD-10-CM | POA: Diagnosis not present

## 2019-09-13 MED ORDER — MEDROXYPROGESTERONE ACETATE 150 MG/ML IM SUSP
150.0000 mg | Freq: Once | INTRAMUSCULAR | Status: AC
  Administered 2019-09-13: 150 mg via INTRAMUSCULAR

## 2019-09-13 NOTE — Progress Notes (Signed)
Date last pap: NA Last Depo-Provera:06/19/2019 Side Effects if any: None Serum HCG indicated? N/A Depo-Provera 150 mg IM given by: [facility] CMA  Next appointment due 11/29/2019-12/13/2019

## 2019-11-21 ENCOUNTER — Telehealth: Payer: Self-pay | Admitting: Certified Nurse Midwife

## 2019-11-21 NOTE — Telephone Encounter (Signed)
Called pt couldn't leave message to resch. Pt has my chart moved appt

## 2019-12-11 ENCOUNTER — Ambulatory Visit: Payer: Medicaid Other

## 2019-12-12 ENCOUNTER — Ambulatory Visit: Payer: Medicaid Other

## 2020-04-12 NOTE — L&D Delivery Note (Signed)
       Delivery Note   Angela Fox is a 35 y.o. [id number] at [redacted]w[redacted]d Estimated Date of Delivery: 03/23/21  PRE-OPERATIVE DIAGNOSIS:  1) [redacted]w[redacted]d pregnancy.  2)  Advanced labor  POST-OPERATIVE DIAGNOSIS:  1) [redacted]w[redacted]d pregnancy s/p Vaginal, Spontaneous  2)  Viable female infant 3)  Macrosomia  Delivery Type: Vaginal, Spontaneous    Delivery Anesthesia: None     ESTIMATED BLOOD LOSS: 25  ml    FINDINGS:   1) female infant, Apgar scores of 8   at 1 minute and 9   at 5 minutes and a birthweight of   ounces.    2) Nuchal cord: no  SPECIMENS:   PLACENTA:   Appearance: Intact    Removal: Spontaneous      Disposition:    DISPOSITION:  Infant to left in stable condition in the delivery room, with L&D personnel and mother,  NARRATIVE SUMMARY: Labor course:  [name] is a [id number] at [redacted]w[redacted]d who presented for labor management.  She was found to be completely dilated.  AROM - clear fluid was performed. She evidenced good maternal expulsive effort during the second stage. After delivery of the fetal vertex a mild should dystocia was noted.  This resolved with McRoberts postioning and lowering the head of the bed. She delivered a viable infant. The placenta delivered without problems and was noted to be complete. A perineal and vaginal examination was performed. Episiotomy/Lacerations: None    Elonda Husky, M.D. 03/23/2021 11:44 AM
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
## 2020-08-01 ENCOUNTER — Ambulatory Visit (INDEPENDENT_AMBULATORY_CARE_PROVIDER_SITE_OTHER): Payer: Medicaid Other | Admitting: Obstetrics and Gynecology

## 2020-08-01 ENCOUNTER — Other Ambulatory Visit: Payer: Self-pay

## 2020-08-01 ENCOUNTER — Encounter: Payer: Self-pay | Admitting: Obstetrics and Gynecology

## 2020-08-01 VITALS — BP 109/74 | HR 83 | Ht 71.0 in | Wt 176.0 lb

## 2020-08-01 DIAGNOSIS — N912 Amenorrhea, unspecified: Secondary | ICD-10-CM

## 2020-08-01 DIAGNOSIS — Z3200 Encounter for pregnancy test, result unknown: Secondary | ICD-10-CM | POA: Diagnosis not present

## 2020-08-01 LAB — POCT URINE PREGNANCY: Preg Test, Ur: POSITIVE — AB

## 2020-08-01 NOTE — Progress Notes (Signed)
HPI:      Angela Fox is a 35 y.o. [id number] who LMP was Patient's last menstrual period was 06/16/2020.  Subjective:   She presents today she has missed a menstrual period and she is concerned she may be pregnant.  She also complains of abdominal/pelvic pain.  She says it is not crampy more of a constant issue.  Her pain is not influenced by urination or bowel movements.  She does report that she has been especially constipated recently and this is not usually a problem for her. She had 2 previous vaginal births pregnancies which were uncomplicated.  Her last pregnancy did end in first trimester miscarriage. She was not attempting pregnancy but not especially preventing it either.   Hx: The following portions of the patient's history were reviewed and updated as appropriate:             She  has a past medical history of Abnormal Pap smear of cervix, Amenorrhea, and Endometriosis. She does not have any pertinent problems on file. She  has a past surgical history that includes LEEP and Cervical biopsy w/ loop electrode excision. Her family history includes Cancer in her mother; Migraines in her brother and mother; Rheum arthritis in her maternal grandfather and maternal grandmother; Seizures in her mother; Stroke in her mother. She  reports that she has never smoked. She has never used smokeless tobacco. She reports that she does not drink alcohol and does not use drugs. She has a current medication list which includes the following prescription(s): acyclovir. She has No Known Allergies.       Review of Systems:  Review of Systems  Constitutional: Denied constitutional symptoms, night sweats, recent illness, fatigue, fever, insomnia and weight loss.  Eyes: Denied eye symptoms, eye pain, photophobia, vision change and visual disturbance.  Ears/Nose/Throat/Neck: Denied ear, nose, throat or neck symptoms, hearing loss, nasal discharge, sinus congestion and sore throat.  Cardiovascular:  Denied cardiovascular symptoms, arrhythmia, chest pain/pressure, edema, exercise intolerance, orthopnea and palpitations.  Respiratory: Denied pulmonary symptoms, asthma, pleuritic pain, productive sputum, cough, dyspnea and wheezing.  Gastrointestinal: Denied, gastro-esophageal reflux, melena, nausea and vomiting.  Genitourinary: Denied genitourinary symptoms including symptomatic vaginal discharge, pelvic relaxation issues, and urinary complaints.  Musculoskeletal: Denied musculoskeletal symptoms, stiffness, swelling, muscle weakness and myalgia.  Dermatologic: Denied dermatology symptoms, rash and scar.  Neurologic: Denied neurology symptoms, dizziness, headache, neck pain and syncope.  Psychiatric: Denied psychiatric symptoms, anxiety and depression.  Endocrine: Denied endocrine symptoms including hot flashes and night sweats.   Meds:   Current Outpatient Medications on File Prior to Visit  Medication Sig Dispense Refill  . acyclovir (ZOVIRAX) 400 MG tablet Take 1 tablet (400 mg total) by mouth 2 (two) times daily. (Patient not taking: Reported on 08/01/2020) 60 tablet 3   No current facility-administered medications on file prior to visit.          Objective:     Vitals:   08/01/20 0955  BP: 109/74  Pulse: 83   Filed Weights   08/01/20 0955  Weight: 176 lb (79.8 kg)              urinary beta-hCG positive  Assessment:    [id number] Patient Active Problem List   Diagnosis Date Noted  . Irregular menses 03/20/2019  . Major depressive disorder, single episode, moderate (HCC) 02/27/2018  . Generalized anxiety disorder 02/27/2018  . Herpes simplex virus type 2 (HSV-2) infection affecting pregnancy in third trimester 08/19/2016     1.  Amenorrhea   2. Possible pregnancy, not confirmed     Approximately 4 weeks estimated gestational age based on LMP.  Abdominal/pelvic pain may be from uterine size increase- possible constipation as this is a new problem for  her.   Plan:            Prenatal Plan 1.  The patient was given prenatal literature. 2.  She was begun on prenatal vitamins. 3.  A prenatal lab panel to be drawn at nurse visit. 4.  An ultrasound was ordered to better determine an EDC. 5.  A nurse visit was scheduled. 6.  Genetic testing and testing for other inheritable conditions discussed in detail. She will decide in the future whether to have these labs performed. 7.  A general overview of pregnancy testing, visit schedule, ultrasound schedule, and prenatal care was discussed. 8.  COVID and its risks associated with pregnancy, prevention by limiting exposure and use of masks, as well as the risks and benefits of vaccination during pregnancy were discussed in detail.  [facility] policy regarding office and hospital visitation and testing was explained. 9.  Benefits of breast-feeding discussed in detail including both maternal and infant benefits. Ready Set Baby website discussed. 10.  Encouraged use of Citrucel to regulate bowel movements and relieve constipation.  Advised increase fluid intake.    Orders Orders Placed This Encounter  Procedures  . US OB Comp Less 14 Wks  . POCT urine pregnancy    No orders of the defined types were placed in this encounter.     F/U  Return in about 4 weeks (around 08/29/2020). I spent 32 minutes involved in the care of this patient preparing to see the patient by obtaining and reviewing her medical history (including labs, imaging tests and prior procedures), documenting clinical information in the electronic health record (EHR), counseling and coordinating care plans, writing and sending prescriptions, ordering tests or procedures and directly communicating with the patient by discussing pertinent items from her history and physical exam as well as detailing my assessment and plan as noted above so that she has an informed understanding.  All of her questions were answered.  Elonda Husky,  M.D. 08/01/2020 10:21 AM

## 2020-08-06 ENCOUNTER — Other Ambulatory Visit: Payer: Self-pay | Admitting: Surgical

## 2020-08-06 MED ORDER — ACYCLOVIR 400 MG PO TABS: 400.0000 mg | ORAL_TABLET | Freq: Two times a day (BID) | ORAL | 0 refills | Status: DC

## 2020-09-02 ENCOUNTER — Other Ambulatory Visit: Payer: Self-pay | Admitting: Obstetrics and Gynecology

## 2020-09-05 ENCOUNTER — Emergency Department
Admission: EM | Admit: 2020-09-05 | Discharge: 2020-09-05 | Disposition: A | Discharge status: Home / Self Care | Payer: Medicaid Other | Attending: Emergency Medicine | Admitting: Emergency Medicine

## 2020-09-05 ENCOUNTER — Other Ambulatory Visit: Payer: Self-pay

## 2020-09-05 ENCOUNTER — Ambulatory Visit (INDEPENDENT_AMBULATORY_CARE_PROVIDER_SITE_OTHER): Payer: Medicaid Other | Admitting: Surgical

## 2020-09-05 VITALS — BP 115/69 | HR 89 | Ht 71.0 in | Wt 175.8 lb

## 2020-09-05 DIAGNOSIS — Z3A12 12 weeks gestation of pregnancy: Secondary | ICD-10-CM | POA: Diagnosis not present

## 2020-09-05 DIAGNOSIS — Z5321 Procedure and treatment not carried out due to patient leaving prior to being seen by health care provider: Secondary | ICD-10-CM | POA: Insufficient documentation

## 2020-09-05 DIAGNOSIS — O26851 Spotting complicating pregnancy, first trimester: Secondary | ICD-10-CM | POA: Diagnosis not present

## 2020-09-05 DIAGNOSIS — R1031 Right lower quadrant pain: Secondary | ICD-10-CM | POA: Insufficient documentation

## 2020-09-05 DIAGNOSIS — Z3491 Encounter for supervision of normal pregnancy, unspecified, first trimester: Secondary | ICD-10-CM

## 2020-09-05 LAB — HCG, QUANTITATIVE, PREGNANCY: hCG, Beta Chain, Quant, S: 83260 m[IU]/mL — ABNORMAL HIGH (ref <5)

## 2020-09-05 NOTE — Progress Notes (Signed)
Angela Fox presents for NOB nurse interview visit. Pregnancy confirmation done _4/22/2022. G4. [id number]. Pregnancy education material explained and given. __0_ cats in home. NOB labs ordered.  Sickle cell ordered due to patient's race. HIV labs and drug screen were explained and ordered. PNV encouraged. Genetic screening options discussed. Genetic testing: ordered. Patient may discuss with the provider. Patient to follow up with provider in 09/18/2020 weeks for NOB physical. All questions answered. FMLA and drug screen forms signed.

## 2020-09-05 NOTE — ED Triage Notes (Signed)
Pt in from home with RLQ pain since yesterday and vaginal bleeding that started today. Is [redacted] weeks pregnant. Pain spreading to lower back per pt. Pt in NAD otherwise.

## 2020-09-05 NOTE — Patient Instructions (Signed)
[email or web address]">  First Trimester of Pregnancy  The first trimester of pregnancy starts on the first day of your last menstrual period until the end of week 12. This is also called months 1 through 3 of pregnancy. Body changes during your first trimester Your body goes through many changes during pregnancy. The changes usually return to normal after your baby is born. Physical changes  You may gain or lose weight.  Your breasts may grow larger and hurt. The area around your nipples may get darker.  Dark spots or blotches may develop on your face.  You may have changes in your hair. Health changes  You may feel like you might vomit (nauseous), and you may vomit.  You may have heartburn.  You may have headaches.  You may have trouble pooping (constipation).  Your gums may bleed. Other changes  You may get tired easily.  You may pee (urinate) more often.  Your menstrual periods will stop.  You may not feel hungry.  You may want to eat certain kinds of food.  You may have changes in your emotions from day to day.  You may have more dreams. Follow these instructions at home: Medicines  Take over-the-counter and prescription medicines only as told by your doctor. Some medicines are not safe during pregnancy.  Take a prenatal vitamin that contains at least 600 micrograms (mcg) of folic acid. Eating and drinking  Eat healthy meals that include: ? Fresh fruits and vegetables. ? Whole grains. ? Good sources of protein, such as meat, eggs, or tofu. ? Low-fat dairy products.  Avoid raw meat and unpasteurized juice, milk, and cheese.  If you feel like you may vomit, or you vomit: ? Eat 4 or 5 small meals a day instead of 3 large meals. ? Try eating a few soda crackers. ? Drink liquids between meals instead of during meals.  You may need to take these actions to prevent or treat trouble pooping: ? Drink enough fluids to keep your pee  (urine) pale yellow. ? Eat foods that are high in fiber. These include beans, whole grains, and fresh fruits and vegetables. ? Limit foods that are high in fat and sugar. These include fried or sweet foods. Activity  Exercise only as told by your doctor. Most people can do their usual exercise routine during pregnancy.  Stop exercising if you have cramps or pain in your lower belly (abdomen) or low back.  Do not exercise if it is too hot or too humid, or if you are in a place of great height (high altitude).  Avoid heavy lifting.  If you choose to, you may have sex unless your doctor tells you not to. Relieving pain and discomfort  Wear a good support bra if your breasts are sore.  Rest with your legs raised (elevated) if you have leg cramps or low back pain.  If you have bulging veins (varicose veins) in your legs: ? Wear support hose as told by your doctor. ? Raise your feet for 15 minutes, 3-4 times a day. ? Limit salt in your food. Safety  Wear your seat belt at all times when you are in a car.  Talk with your doctor if someone is hurting you or yelling at you.  Talk with your doctor if you are feeling sad or have thoughts of hurting yourself. Lifestyle  Do not use hot tubs, steam rooms, or saunas.  Do not douche. Do not use tampons or scented sanitary pads.  Do not  use herbal medicines, illegal drugs, or medicines that are not approved by your doctor. Do not drink alcohol.  Do not smoke or use any products that contain nicotine or tobacco. If you need help quitting, ask your doctor.  Avoid cat litter boxes and soil that is used by cats. These carry germs that can cause harm to the baby and can cause a loss of your baby by miscarriage or stillbirth. General instructions  Keep all follow-up visits. This is important.  Ask for help if you need counseling or if you need help with nutrition. Your doctor can give you advice or tell you where to go for help.  Visit your  dentist. At home, brush your teeth with a soft toothbrush. Floss gently.  Write down your questions. Take them to your prenatal visits. Where to find more information  American Pregnancy Association: americanpregnancy.org  American College of Obstetricians and Gynecologists: www.acog.org  Office on Women's Health: womenshealth.gov/pregnancy Contact a doctor if:  You are dizzy.  You have a fever.  You have mild cramps or pressure in your lower belly.  You have a nagging pain in your belly area.  You continue to feel like you may vomit, you vomit, or you have watery poop (diarrhea) for 24 hours or longer.  You have a bad-smelling fluid coming from your vagina.  You have pain when you pee.  You are exposed to a disease that spreads from person to person, such as chickenpox, measles, Zika virus, HIV, or hepatitis. Get help right away if:  You have spotting or bleeding from your vagina.  You have very bad belly cramping or pain.  You have shortness of breath or chest pain.  You have any kind of injury, such as from a fall or a car crash.  You have new or increased pain, swelling, or redness in an arm or leg. Summary  The first trimester of pregnancy starts on the first day of your last menstrual period until the end of week 12 (months 1 through 3).  Eat 4 or 5 small meals a day instead of 3 large meals.  Do not smoke or use any products that contain nicotine or tobacco. If you need help quitting, ask your doctor.  Keep all follow-up visits. This information is not intended to replace advice given to you by your health care provider. Make sure you discuss any questions you have with your health care provider. Document Revised: 09/05/2019 Document Reviewed: 07/12/2019 Elsevier Patient Education  2021 Elsevier Inc.    Common Medications Safe in Pregnancy  Acne:      Constipation:  Benzoyl Peroxide     Colace  Clindamycin      Dulcolax Suppository  Topica  Erythromycin     Fibercon  Salicylic Acid      Metamucil         Miralax AVOID:        Senakot   Accutane    Cough:  Retin-A       Cough Drops  Tetracycline      Phenergan w/ Codeine if Rx  Minocycline      Robitussin (Plain & DM)  Antibiotics:     Crabs/Lice:  Ceclor       RID  Cephalosporins    AVOID:  E-Mycins      Kwell  Keflex  Macrobid/Macrodantin   Diarrhea:  Penicillin      Kao-Pectate  Zithromax      Imodium AD           PUSH FLUIDS AVOID:       Cipro     Fever:  Tetracycline      Tylenol (Regular or Extra  Minocycline       Strength)  Levaquin      Extra Strength-Do not          Exceed 8 tabs/24 hrs Caffeine:        <200mg/day (equiv. To 1 cup of coffee or  approx. 3 12 oz sodas)         Gas: Cold/Hayfever:       Gas-X  Benadryl      Mylicon  Claritin       Phazyme  **Claritin-D        Chlor-Trimeton    Headaches:  Dimetapp      ASA-Free Excedrin  Drixoral-Non-Drowsy     Cold Compress  Mucinex (Guaifenasin)     Tylenol (Regular or Extra  Sudafed/Sudafed-12 Hour     Strength)  **Sudafed PE Pseudoephedrine   Tylenol Cold & Sinus     Vicks Vapor Rub  Zyrtec  **AVOID if Problems With Blood Pressure         Heartburn: Avoid lying down for at least 1 hour after meals  Aciphex      Maalox     Rash:  Milk of Magnesia     Benadryl    Mylanta       1% Hydrocortisone Cream  Pepcid  Pepcid Complete   Sleep Aids:  Prevacid      Ambien   Prilosec       Benadryl  Rolaids       Chamomile Tea  Tums (Limit 4/day)     Unisom         Tylenol PM         Warm milk-add vanilla or  Hemorrhoids:       Sugar for taste  Anusol/Anusol H.C.  (RX: Analapram 2.5%)  Sugar Substitutes:  Hydrocortisone OTC     Ok in moderation  Preparation H      Tucks        Vaseline lotion applied to tissue with wiping    Herpes:     Throat:  Acyclovir      Oragel  Famvir  Valtrex     Vaccines:         Flu Shot Leg Cramps:       *Gardasil  Benadryl      Hepatitis  A         Hepatitis B Nasal Spray:       Pneumovax  Saline Nasal Spray     Polio Booster         Tetanus Nausea:       Tuberculosis test or PPD  Vitamin B6 25 mg TID   AVOID:    Dramamine      *Gardasil  Emetrol       Live Poliovirus  Ginger Root 250 mg QID    MMR (measles, mumps &  High Complex Carbs @ Bedtime    rebella)  Sea Bands-Accupressure    Varicella (Chickenpox)  Unisom 1/2 tab TID     *No known complications           If received before Pain:         Known pregnancy;   Darvocet       Resume series after  Lortab        Delivery  Percocet    Yeast:   Tramadol        Femstat  Tylenol 3      Gyne-lotrimin  Ultram       Monistat  Vicodin           MISC:         All Sunscreens           Hair Coloring/highlights          Insect Repellant's          (Including DEET)         Mystic Tans  

## 2020-09-05 NOTE — ED Notes (Signed)
lav and grn tubes sent to lab.  

## 2020-09-06 ENCOUNTER — Other Ambulatory Visit: Payer: Self-pay

## 2020-09-06 ENCOUNTER — Emergency Department
Admission: EM | Admit: 2020-09-06 | Discharge: 2020-09-06 | Disposition: A | Discharge status: Home / Self Care | Payer: Medicaid Other | Attending: Emergency Medicine | Admitting: Emergency Medicine

## 2020-09-06 ENCOUNTER — Encounter: Payer: Self-pay | Admitting: Intensive Care

## 2020-09-06 ENCOUNTER — Emergency Department: Payer: Medicaid Other

## 2020-09-06 DIAGNOSIS — O4691 Antepartum hemorrhage, unspecified, first trimester: Secondary | ICD-10-CM | POA: Insufficient documentation

## 2020-09-06 DIAGNOSIS — Z3A12 12 weeks gestation of pregnancy: Secondary | ICD-10-CM | POA: Insufficient documentation

## 2020-09-06 DIAGNOSIS — N939 Abnormal uterine and vaginal bleeding, unspecified: Secondary | ICD-10-CM

## 2020-09-06 DIAGNOSIS — O4690 Antepartum hemorrhage, unspecified, unspecified trimester: Secondary | ICD-10-CM

## 2020-09-06 LAB — CBC WITH DIFFERENTIAL/PLATELET
Basophils Absolute: 0 10*3/uL (ref 0.0–0.2)
Basos: 0 %
EOS (ABSOLUTE): 0.1 10*3/uL (ref 0.0–0.4)
Eos: 1 %
Hematocrit: 38.4 % (ref 34.0–46.6)
Hemoglobin: 12.6 g/dL (ref 11.1–15.9)
Immature Grans (Abs): 0 10*3/uL (ref 0.0–0.1)
Immature Granulocytes: 0 %
Lymphocytes Absolute: 1.3 10*3/uL (ref 0.7–3.1)
Lymphs: 21 %
MCH: 27.7 pg (ref 26.6–33.0)
MCHC: 32.8 g/dL (ref 31.5–35.7)
MCV: 84 fL (ref 79–97)
Monocytes Absolute: 0.4 10*3/uL (ref 0.1–0.9)
Monocytes: 6 %
Neutrophils Absolute: 4.3 10*3/uL (ref 1.4–7.0)
Neutrophils: 72 %
Platelets: 246 10*3/uL (ref 150–450)
RBC: 4.55 x10E6/uL (ref 3.77–5.28)
RDW: 12.9 % (ref 11.7–15.4)
WBC: 6.1 10*3/uL (ref 3.4–10.8)

## 2020-09-06 LAB — HCV INTERPRETATION

## 2020-09-06 LAB — VARICELLA ZOSTER ANTIBODY, IGG: Varicella zoster IgG: 362 index (ref >165)

## 2020-09-06 LAB — RUBELLA SCREEN: Rubella Antibodies, IGG: 1.2 {index}

## 2020-09-06 LAB — ANTIBODY SCREEN: Antibody Screen: NEGATIVE

## 2020-09-06 LAB — URINALYSIS, ROUTINE W REFLEX MICROSCOPIC

## 2020-09-06 LAB — HIV ANTIBODY (ROUTINE TESTING W REFLEX): HIV Screen 4th Generation wRfx: NONREACTIVE

## 2020-09-06 LAB — VIRAL HEPATITIS HBV, HCV
HCV Ab: <0.1 s/co ratio (ref 0.0–0.9)
Hep B Core Total Ab: NEGATIVE
Hep B Surface Ab, Qual: REACTIVE
Hepatitis B Surface Ag: NEGATIVE

## 2020-09-06 LAB — "ABO AND RH ": Rh Factor: POSITIVE

## 2020-09-06 LAB — HGB SOLU + RFLX FRAC: Sickle Solubility Test - HGBRFX: NEGATIVE

## 2020-09-06 LAB — SYPHILIS: RPR W/REFLEX TO RPR TITER AND TREPONEMAL ANTIBODIES, TRADITIONAL SCREENING AND DIAGNOSIS ALGORITHM: RPR Ser Ql: NONREACTIVE

## 2020-09-06 NOTE — ED Triage Notes (Signed)
Patient reports being [redacted]weeks pregnant. C/o lower right quadrant pain with vaginal bleeding. Left before being seen after being triaged here last night. Today reports bleeding has stopped

## 2020-09-06 NOTE — Discharge Instructions (Addendum)
Follow-up with your regular doctor if not improving to 3 days.  Return emergency department worsening.  Your ultrasound shows a 12-week fetus with no complications.

## 2020-09-06 NOTE — ED Provider Notes (Signed)
[facility] Emergency Department Provider Note  ____________________________________________   Event Date/Time   First MD Initiated Contact with Patient 09/06/20 1333     (approximate)  I have reviewed the triage vital signs and the nursing notes.   HISTORY  Chief Complaint Vaginal Bleeding    HPI Angela Fox is a 35 y.o. female presents emergency department complaining of vaginal bleeding in pregnancy.  Patient states she is approximately [redacted] weeks pregnant.  Started having right lower quadrant pain and some vaginal bleeding yesterday.  States she still has the pain and is spotting in her underwear.  She is not bleeding through 1 pad per hour.  Patient is followed at [facility] for prenatal care.    Past Medical History:  Diagnosis Date  . Abnormal Pap smear of cervix   . Amenorrhea   . Endometriosis     Patient Active Problem List   Diagnosis Date Noted  . Irregular menses 03/20/2019  . Major depressive disorder, single episode, moderate (HCC) 02/27/2018  . Generalized anxiety disorder 02/27/2018  . Herpes simplex virus type 2 (HSV-2) infection affecting pregnancy in third trimester 08/19/2016    Past Surgical History:  Procedure Laterality Date  . CERVICAL BIOPSY  W/ LOOP ELECTRODE EXCISION     pt was 15-16yo  . LEEP      Prior to Admission medications   Medication Sig Start Date End Date Taking? Authorizing Provider  acyclovir (ZOVIRAX) 400 MG tablet TAKE 1 TABLET BY MOUTH TWICE A DAY 09/02/20   Linzie Collin, MD  Prenatal Vit-Fe Fumarate-FA (MULTIVITAMIN-PRENATAL) 27-0.8 MG TABS tablet Take 1 tablet by mouth daily at 12 noon.    [provider]    Allergies Patient has no known allergies.  Family History  Problem Relation Age of Onset  . Cancer Mother   . Migraines Mother   . Seizures Mother   . Stroke Mother   . Rheum arthritis Maternal Grandmother   . Rheum arthritis Maternal Grandfather   . Migraines  Brother     Social History Social History   Tobacco Use  . Smoking status: Never Smoker  . Smokeless tobacco: Never Used  Vaping Use  . Vaping Use: Never used  Substance Use Topics  . Alcohol use: No  . Drug use: No    Review of Systems  Constitutional: No fever/chills Eyes: No visual changes. ENT: No sore throat. Respiratory: Denies cough Cardiovascular: Denies chest pain Gastrointestinal: Denies abdominal pain Genitourinary: Negative for dysuria.  Positive vaginal bleeding Musculoskeletal: Negative for back pain. Skin: Negative for rash. Psychiatric: no mood changes,     ____________________________________________   PHYSICAL EXAM:  VITAL SIGNS: ED Triage Vitals  Enc Vitals Group     BP 09/06/20 1308 106/74     Pulse Rate 09/06/20 1308 (!) 111     Resp 09/06/20 1308 16     Temp 09/06/20 1308 98.3 F (36.8 C)     Temp Source 09/06/20 1308 Oral     SpO2 09/06/20 1308 100 %     Weight 09/06/20 1309 175 lb (79.4 kg)     Height 09/06/20 1309 5\' 11"  (1.803 m)     Head Circumference --      Peak Flow --      Pain Score 09/06/20 1309 4     Pain Loc --      Pain Edu? --      Excl. in GC? --     Constitutional: Alert and oriented. Well  appearing and in no acute distress. Eyes: Conjunctivae are normal.  Head: Atraumatic. Nose: No congestion/rhinnorhea. Mouth/Throat: Mucous membranes are moist.   Neck:  supple no lymphadenopathy noted Cardiovascular: Normal rate, regular rhythm. Heart sounds are normal Respiratory: Normal respiratory effort.  No retractions, lungs c t a  Abd: soft minimally tender in the right lower quadrant, bs normal all 4 quad GU: deferred Musculoskeletal: FROM all extremities, warm and well perfused Neurologic:  Normal speech and language.  Skin:  Skin is warm, dry and intact. No rash noted. Psychiatric: Mood and affect are normal. Speech and behavior are normal.  ____________________________________________   LABS (all labs ordered  are listed, but only abnormal results are displayed)  Labs Reviewed - No data to display ____________________________________________   ____________________________________________  RADIOLOGY  Ultrasound OB less than 14 weeks  ____________________________________________   PROCEDURES  Procedure(s) performed: No  Procedures    ____________________________________________   INITIAL IMPRESSION / ASSESSMENT AND PLAN / ED COURSE  Pertinent labs & imaging results that were available during my care of the patient were reviewed by me and considered in my medical decision making (see chart for details).   Patient is a 35 year old female presents with vaginal bleeding in pregnancy.  Patient was triaged and lab work was done last night but patient left without being seen.  Patient appears to be stable at this time  Labs from last night were reviewed, beta-hCG shows 83,260,    Ultrasound OB less than 14 weeks shows 12-week fetus with a heart rate of 166  I did explain findings to the patient.  She will follow-up with Dr. Valentino Saxon.  She most likely is having round ligament pain.  She was discharged stable condition.  Angela Fox was evaluated in Emergency Department on 09/06/2020 for the symptoms described in the history of present illness. She was evaluated in the context of the global COVID-19 pandemic, which necessitated consideration that the patient might be at risk for infection with the SARS-CoV-2 virus that causes COVID-19. Institutional protocols and algorithms that pertain to the evaluation of patients at risk for COVID-19 are in a state of rapid change based on information released by regulatory bodies including the CDC and federal and state organizations. These policies and algorithms were followed during the patient's care in the ED.    As part of my medical decision making, I reviewed the following data within the electronic MEDICAL RECORD NUMBER Nursing notes reviewed and  incorporated, Labs reviewed , Old chart reviewed, Radiograph reviewed , Notes from prior ED visits and [place] Controlled Substance Database  ____________________________________________   FINAL CLINICAL IMPRESSION(S) / ED DIAGNOSES  Final diagnoses:  Vaginal bleeding  Vaginal bleeding in pregnancy      NEW MEDICATIONS STARTED DURING THIS VISIT:  New Prescriptions   No medications on file     Note:  This document was prepared using Dragon voice recognition software and may include unintentional dictation errors.    Faythe Ghee, PA-C 09/06/20 1451    Jene Every, MD 09/07/20 [phone]

## 2020-09-08 LAB — URINE CULTURE, OB REFLEX

## 2020-09-08 LAB — CULTURE, OB URINE

## 2020-09-09 LAB — GC/CHLAMYDIA PROBE AMP
Chlamydia trachomatis, NAA: NEGATIVE
Neisseria Gonorrhoeae by PCR: POSITIVE — AB

## 2020-09-10 ENCOUNTER — Ambulatory Visit (INDEPENDENT_AMBULATORY_CARE_PROVIDER_SITE_OTHER): Payer: Medicaid Other | Admitting: Obstetrics and Gynecology

## 2020-09-10 ENCOUNTER — Other Ambulatory Visit: Payer: Self-pay

## 2020-09-10 ENCOUNTER — Encounter: Payer: Self-pay | Admitting: Obstetrics and Gynecology

## 2020-09-10 DIAGNOSIS — A549 Gonococcal infection, unspecified: Secondary | ICD-10-CM | POA: Diagnosis not present

## 2020-09-10 LAB — MONITOR DRUG PROFILE 14(MW)
Amphetamine Scrn, Ur: NEGATIVE ng/mL
BARBITURATE SCREEN URINE: NEGATIVE ng/mL
BENZODIAZEPINE SCREEN, URINE: NEGATIVE ng/mL
Buprenorphine, Urine: NEGATIVE ng/mL
CANNABINOIDS UR QL SCN: NEGATIVE ng/mL
Cocaine (Metab) Scrn, Ur: NEGATIVE ng/mL
Creatinine(Crt), U: 213.8 mg/dL (ref 20.0–300.0)
Fentanyl, Urine: NEGATIVE pg/mL
Meperidine Screen, Urine: NEGATIVE ng/mL
Methadone Screen, Urine: NEGATIVE ng/mL
OXYCODONE+OXYMORPHONE UR QL SCN: NEGATIVE ng/mL
Opiate Scrn, Ur: NEGATIVE ng/mL
Ph of Urine: 6.6 (ref 4.5–8.9)
Phencyclidine Qn, Ur: NEGATIVE ng/mL
Propoxyphene Scrn, Ur: NEGATIVE ng/mL
SPECIFIC GRAVITY: 1.023
Tramadol Screen, Urine: NEGATIVE ng/mL

## 2020-09-10 MED ORDER — CEFTRIAXONE SODIUM 500 MG IJ SOLR
500.0000 mg | Freq: Once | INTRAMUSCULAR | Status: AC
  Administered 2020-09-10: 500 mg via INTRAMUSCULAR

## 2020-09-10 NOTE — Progress Notes (Signed)
Patient comes in today for injection of ceftriaxone to treat Gonorrhea. Patient tolerated injection well.

## 2020-09-12 LAB — MATERNIT21  PLUS CORE+ESS+SCA, BLOOD

## 2020-09-18 ENCOUNTER — Other Ambulatory Visit: Payer: Self-pay

## 2020-09-18 ENCOUNTER — Ambulatory Visit (INDEPENDENT_AMBULATORY_CARE_PROVIDER_SITE_OTHER): Payer: Medicaid Other | Admitting: Obstetrics and Gynecology

## 2020-09-18 ENCOUNTER — Encounter: Payer: Self-pay | Admitting: Obstetrics and Gynecology

## 2020-09-18 ENCOUNTER — Other Ambulatory Visit (HOSPITAL_COMMUNITY)
Admission: RE | Admit: 2020-09-18 | Discharge: 2020-09-18 | Disposition: A | Discharge status: Home / Self Care | Payer: Medicaid Other | Source: Ambulatory Visit | Attending: Obstetrics and Gynecology | Admitting: Obstetrics and Gynecology

## 2020-09-18 VITALS — BP 105/68 | HR 82 | Wt 173.5 lb

## 2020-09-18 DIAGNOSIS — Z124 Encounter for screening for malignant neoplasm of cervix: Secondary | ICD-10-CM | POA: Diagnosis not present

## 2020-09-18 DIAGNOSIS — Z3A13 13 weeks gestation of pregnancy: Secondary | ICD-10-CM

## 2020-09-18 DIAGNOSIS — Z3491 Encounter for supervision of normal pregnancy, unspecified, first trimester: Secondary | ICD-10-CM

## 2020-09-18 LAB — POCT URINALYSIS DIPSTICK OB
Bilirubin, UA: NEGATIVE
Blood, UA: NEGATIVE
Glucose, UA: NEGATIVE
Ketones, UA: NEGATIVE
Leukocytes, UA: NEGATIVE
Nitrite, UA: NEGATIVE
POC,PROTEIN,UA: NEGATIVE
Spec Grav, UA: 1.01 (ref 1.010–1.025)
Urobilinogen, UA: 0.2 E.U./dL
pH, UA: 7 (ref 5.0–8.0)

## 2020-09-18 NOTE — Addendum Note (Signed)
Addended by: Dorian Pod on: 09/18/2020 11:49 AM   Modules accepted: Orders

## 2020-09-18 NOTE — Progress Notes (Signed)
NOB: Patient treated last week for positive gonorrhea.  Still feels blah during first trimester but generally no vomiting and things are slowly improving.  Taking vitamins as directed.  Normal ultrasound and normal MaterniT 21.  aFP next visit  Pap performed today  Physical examination General NAD, Conversant  HEENT Atraumatic; Op clear with mmm.  Normo-cephalic. Pupils reactive. Anicteric sclerae  Thyroid/Neck Smooth without nodularity or enlargement. Normal ROM.  Neck Supple.  Skin No rashes, lesions or ulceration. Normal palpated skin turgor. No nodularity.  Breasts: No masses or discharge.  Symmetric.  No axillary adenopathy.  Lungs: Clear to auscultation.No rales or wheezes. Normal Respiratory effort, no retractions.  Heart: NSR.  No murmurs or rubs appreciated. No periferal edema  Abdomen: Soft.  Non-tender.  No masses.  No HSM. No hernia  Extremities: Moves all appropriately.  Normal ROM for age. No lymphadenopathy.  Neuro: Oriented to PPT.  Normal mood. Normal affect.     Pelvic:   Vulva: Normal appearance.  No lesions.  Vagina: No lesions or abnormalities noted.  Support: Normal pelvic support.  Urethra No masses tenderness or scarring.  Meatus Normal size without lesions or prolapse.  Cervix: Normal appearance.  No lesions.  Anus: Normal exam.  No lesions.  Perineum: Normal exam.  No lesions.        Bimanual   Adnexae: No masses.  Non-tender to palpation.  Uterus: Enlarged. 13wks Pos FHTs  Non-tender.  Mobile.  AV.  Adnexae: No masses.  Non-tender to palpation.  Cul-de-sac: Negative for abnormality.  Adnexae: No masses.  Non-tender to palpation.         Pelvimetry   Diagonal: Reached.  Spines: Average.  Sacrum: Concave.  Pubic Arch: Normal.

## 2020-09-19 ENCOUNTER — Encounter: Payer: Medicaid Other | Admitting: Obstetrics and Gynecology

## 2020-09-23 LAB — CYTOLOGY - PAP
Comment: NEGATIVE
Diagnosis: NEGATIVE
High risk HPV: NEGATIVE

## 2020-09-25 DIAGNOSIS — A549 Gonococcal infection, unspecified: Secondary | ICD-10-CM

## 2020-09-25 HISTORY — DX: Gonococcal infection, unspecified: A54.9

## 2020-10-02 ENCOUNTER — Other Ambulatory Visit: Payer: Self-pay

## 2020-10-02 DIAGNOSIS — Z3482 Encounter for supervision of other normal pregnancy, second trimester: Secondary | ICD-10-CM

## 2020-10-03 ENCOUNTER — Ambulatory Visit: Admission: RE | Admit: 2020-10-03 | Payer: Medicaid Other | Source: Ambulatory Visit

## 2020-10-12 ENCOUNTER — Other Ambulatory Visit: Payer: Self-pay | Admitting: Obstetrics and Gynecology

## 2020-10-17 ENCOUNTER — Ambulatory Visit (INDEPENDENT_AMBULATORY_CARE_PROVIDER_SITE_OTHER): Payer: Medicaid Other | Admitting: Obstetrics and Gynecology

## 2020-10-17 ENCOUNTER — Other Ambulatory Visit: Payer: Self-pay

## 2020-10-17 ENCOUNTER — Encounter: Payer: Self-pay | Admitting: Obstetrics and Gynecology

## 2020-10-17 VITALS — BP 101/68 | HR 89 | Wt 174.3 lb

## 2020-10-17 DIAGNOSIS — O219 Vomiting of pregnancy, unspecified: Secondary | ICD-10-CM

## 2020-10-17 DIAGNOSIS — Z3482 Encounter for supervision of other normal pregnancy, second trimester: Secondary | ICD-10-CM

## 2020-10-17 DIAGNOSIS — N898 Other specified noninflammatory disorders of vagina: Secondary | ICD-10-CM

## 2020-10-17 DIAGNOSIS — Z1379 Encounter for other screening for genetic and chromosomal anomalies: Secondary | ICD-10-CM

## 2020-10-17 DIAGNOSIS — O09892 Supervision of other high risk pregnancies, second trimester: Secondary | ICD-10-CM | POA: Insufficient documentation

## 2020-10-17 DIAGNOSIS — Z3A17 17 weeks gestation of pregnancy: Secondary | ICD-10-CM

## 2020-10-17 LAB — POCT URINALYSIS DIPSTICK OB
Bilirubin, UA: NEGATIVE
Blood, UA: NEGATIVE
Glucose, UA: NEGATIVE
Ketones, UA: NEGATIVE
Nitrite, UA: NEGATIVE
POC,PROTEIN,UA: NEGATIVE
Spec Grav, UA: 1.02 (ref 1.010–1.025)
Urobilinogen, UA: 0.2 E.U./dL
pH, UA: 6.5 (ref 5.0–8.0)

## 2020-10-17 MED ORDER — DOXYLAMINE-PYRIDOXINE 10-10 MG PO TBEC: 2.0000 | DELAYED_RELEASE_TABLET | Freq: Every day | ORAL | 5 refills | Status: DC

## 2020-10-17 NOTE — Progress Notes (Signed)
OB-Pt present for routine prenatal care. Pt c/o vaginal itching possible yeast infection took Monistat some relief and lower abd pain.

## 2020-10-17 NOTE — Progress Notes (Signed)
ROB: Patient complains of vaginal itching and lower abdominal pain, possible yeast infection, however attempted to use OTC Monistat without relief. Does have h/o gonorrhea in first trimester, is due for TOC today. Reports partner treatment and avoidance of intercourse until after treatment.  Will perform Nuswab.  Normal MaterniT21, for AFP today. Nausea and vomiting still present, but a little better. Will prescribe Diclegis. RTC in 4 weeks, for anatomy scan then.

## 2020-10-17 NOTE — Patient Instructions (Signed)
Second Trimester of Pregnancy  The second trimester of pregnancy is from week 13 through week 27. This is also called months 4 through 6 of pregnancy. This is often the time when you feelyour best. During the second trimester: Morning sickness is less or has stopped. You may have more energy. You may feel hungry more often. At this time, your unborn baby (fetus) is growing very fast. At the end of the sixth month, the unborn baby may be up to 12 inches long and weigh about 1 pounds. You will likely start to feelthe baby move between 16 and 20 weeks of pregnancy. Body changes during your second trimester Your body continues to go through many changes during this time. The changesvary and generally return to normal after the baby is born. Physical changes You will gain more weight. You may start to get stretch marks on your hips, belly (abdomen), and breasts. Your breasts will grow and may hurt. Dark spots or blotches may develop on your face. A dark line from your belly button to the pubic area (linea nigra) may appear. You may have changes in your hair. Health changes You may have headaches. You may have heartburn. You may have trouble pooping (constipation). You may have hemorrhoids or swollen, bulging veins (varicose veins). Your gums may bleed. You may pee (urinate) more often. You may have back pain. Follow these instructions at home: Medicines Take over-the-counter and prescription medicines only as told by your doctor. Some medicines are not safe during pregnancy. Take a prenatal vitamin that contains at least 600 micrograms (mcg) of folic acid. Eating and drinking Eat healthy meals that include: Fresh fruits and vegetables. Whole grains. Good sources of protein, such as meat, eggs, or tofu. Low-fat dairy products. Avoid raw meat and unpasteurized juice, milk, and cheese. You may need to take these actions to prevent or treat trouble pooping: Drink enough fluids to keep  your pee (urine) pale yellow. Eat foods that are high in fiber. These include beans, whole grains, and fresh fruits and vegetables. Limit foods that are high in fat and sugar. These include fried or sweet foods. Activity Exercise only as told by your doctor. Most people can do their usual exercise during pregnancy. Try to exercise for 30 minutes at least 5 days a week. Stop exercising if you have pain or cramps in your belly or lower back. Do not exercise if it is too hot or too humid, or if you are in a place of great height (high altitude). Avoid heavy lifting. If you choose to, you may have sex unless your doctor tells you not to. Relieving pain and discomfort Wear a good support bra if your breasts are sore. Take warm water baths (sitz baths) to soothe pain or discomfort caused by hemorrhoids. Use hemorrhoid cream if your doctor approves. Rest with your legs raised (elevated) if you have leg cramps or low back pain. If you develop bulging veins in your legs: Wear support hose as told by your doctor. Raise your feet for 15 minutes, 3-4 times a day. Limit salt in your food. Safety Wear your seat belt at all times when you are in a car. Talk with your doctor if someone is hurting you or yelling at you a lot. Lifestyle Do not use hot tubs, steam rooms, or saunas. Do not douche. Do not use tampons or scented sanitary pads. Avoid cat litter boxes and soil used by cats. These carry germs that can harm your baby and can cause   a loss of your baby by miscarriage or stillbirth. Do not use herbal medicines, illegal drugs, or medicines that are not approved by your doctor. Do not drink alcohol. Do not smoke or use any products that contain nicotine or tobacco. If you need help quitting, ask your doctor. General instructions Keep all follow-up visits. This is important. Ask your doctor about local prenatal classes. Ask your doctor about the right foods to eat or for help finding a  counselor. Where to find more information American Pregnancy Association: americanpregnancy.org American College of Obstetricians and Gynecologists: www.acog.org Office on Women's Health: womenshealth.gov/pregnancy Contact a doctor if: You have a headache that does not go away when you take medicine. You have changes in how you see, or you see spots in front of your eyes. You have mild cramps, pressure, or pain in your lower belly. You continue to feel like you may vomit (nauseous), you vomit, or you have watery poop (diarrhea). You have bad-smelling fluid coming from your vagina. You have pain when you pee or your pee smells bad. You have very bad swelling of your face, hands, ankles, feet, or legs. You have a fever. Get help right away if: You are leaking fluid from your vagina. You have spotting or bleeding from your vagina. You have very bad belly cramping or pain. You have trouble breathing. You have chest pain. You faint. You have not felt your baby move for the time period told by your doctor. You have new or increased pain, swelling, or redness in an arm or leg. Summary The second trimester of pregnancy is from week 13 through week 27 (months 4 through 6). Eat healthy meals. Exercise as told by your doctor. Most people can do their usual exercise during pregnancy. Do not use herbal medicines, illegal drugs, or medicines that are not approved by your doctor. Do not drink alcohol. Call your doctor if you get sick or if you notice anything unusual about your pregnancy. This information is not intended to replace advice given to you by your health care provider. Make sure you discuss any questions you have with your healthcare provider. Document Revised: 09/05/2019 Document Reviewed: 07/12/2019 Elsevier Patient Education  2022 Elsevier Inc. Common Medications Safe in Pregnancy  Acne:      Constipation:  Benzoyl Peroxide     Colace  Clindamycin      Dulcolax  Suppository  Topica Erythromycin     Fibercon  Salicylic Acid      Metamucil         Miralax AVOID:        Senakot   Accutane    Cough:  Retin-A       Cough Drops  Tetracycline      Phenergan w/ Codeine if Rx  Minocycline      Robitussin (Plain & DM)  Antibiotics:     Crabs/Lice:  Ceclor       RID  Cephalosporins    AVOID:  E-Mycins      Kwell  Keflex  Macrobid/Macrodantin   Diarrhea:  Penicillin      Kao-Pectate  Zithromax      Imodium AD         PUSH FLUIDS AVOID:       Cipro     Fever:  Tetracycline      Tylenol (Regular or Extra  Minocycline       Strength)  Levaquin      Extra Strength-Do not            Exceed 8 tabs/24 hrs Caffeine:        <200mg/day (equiv. To 1 cup of coffee or  approx. 3 12 oz sodas)         Gas: Cold/Hayfever:       Gas-X  Benadryl      Mylicon  Claritin       Phazyme  **Claritin-D        Chlor-Trimeton    Headaches:  Dimetapp      ASA-Free Excedrin  Drixoral-Non-Drowsy     Cold Compress  Mucinex (Guaifenasin)     Tylenol (Regular or Extra  Sudafed/Sudafed-12 Hour     Strength)  **Sudafed PE Pseudoephedrine   Tylenol Cold & Sinus     Vicks Vapor Rub  Zyrtec  **AVOID if Problems With Blood Pressure         Heartburn: Avoid lying down for at least 1 hour after meals  Aciphex      Maalox     Rash:  Milk of Magnesia     Benadryl    Mylanta       1% Hydrocortisone Cream  Pepcid  Pepcid Complete   Sleep Aids:  Prevacid      Ambien   Prilosec       Benadryl  Rolaids       Chamomile Tea  Tums (Limit 4/day)     Unisom         Tylenol PM         Warm milk-add vanilla or  Hemorrhoids:       Sugar for taste  Anusol/Anusol H.C.  (RX: Analapram 2.5%)  Sugar Substitutes:  Hydrocortisone OTC     Ok in moderation  Preparation H      Tucks        Vaseline lotion applied to tissue with wiping    Herpes:     Throat:  Acyclovir      Oragel  Famvir  Valtrex     Vaccines:         Flu Shot Leg  Cramps:       *Gardasil  Benadryl      Hepatitis A         Hepatitis B Nasal Spray:       Pneumovax  Saline Nasal Spray     Polio Booster         Tetanus Nausea:       Tuberculosis test or PPD  Vitamin B6 25 mg TID   AVOID:    Dramamine      *Gardasil  Emetrol       Live Poliovirus  Ginger Root 250 mg QID    MMR (measles, mumps &  High Complex Carbs @ Bedtime    rebella)  Sea Bands-Accupressure    Varicella (Chickenpox)  Unisom 1/2 tab TID     *No known complications           If received before Pain:         Known pregnancy;   Darvocet       Resume series after  Lortab        Delivery  Percocet    Yeast:   Tramadol      Femstat  Tylenol 3      Gyne-lotrimin  Ultram       Monistat  Vicodin           MISC:         All Sunscreens             Hair Coloring/highlights          Insect Repellant's          (Including DEET)         Mystic Tans  

## 2020-10-24 LAB — AFP, SERUM, OPEN SPINA BIFIDA
AFP MoM: 1.35
AFP Value: 58.5 ng/mL
Gest. Age on Collection Date: 17.6 weeks
Maternal Age At EDD: 33.8 yr
OSBR Risk 1 IN: 8303
Test Results:: NEGATIVE
Weight: 174 [lb_av]

## 2020-10-26 LAB — NUSWAB VAGINITIS PLUS (VG+)
BVAB 2: HIGH Score — AB
Candida albicans, NAA: POSITIVE — AB
Candida glabrata, NAA: NEGATIVE
Chlamydia trachomatis, NAA: NEGATIVE
Megasphaera 1: HIGH Score — AB
Neisseria gonorrhoeae, NAA: NEGATIVE
Trich vag by NAA: NEGATIVE

## 2020-10-28 MED ORDER — FLUCONAZOLE 150 MG PO TABS: 150.0000 mg | ORAL_TABLET | Freq: Once | ORAL | 1 refills | Status: AC

## 2020-10-28 MED ORDER — METRONIDAZOLE 500 MG PO TABS: 500.0000 mg | ORAL_TABLET | Freq: Two times a day (BID) | ORAL | 0 refills | Status: DC

## 2020-10-28 NOTE — Addendum Note (Signed)
Addended by: Fabian November on: 10/28/2020 01:11 PM   Modules accepted: Orders

## 2020-11-04 ENCOUNTER — Ambulatory Visit
Admission: RE | Admit: 2020-11-04 | Discharge: 2020-11-04 | Disposition: A | Discharge status: Home / Self Care | Payer: Medicaid Other | Source: Ambulatory Visit | Attending: Obstetrics and Gynecology | Admitting: Obstetrics and Gynecology

## 2020-11-04 ENCOUNTER — Other Ambulatory Visit: Payer: Self-pay

## 2020-11-04 DIAGNOSIS — Z3482 Encounter for supervision of other normal pregnancy, second trimester: Secondary | ICD-10-CM | POA: Insufficient documentation

## 2020-11-13 ENCOUNTER — Ambulatory Visit (INDEPENDENT_AMBULATORY_CARE_PROVIDER_SITE_OTHER): Payer: Medicaid Other | Admitting: Obstetrics and Gynecology

## 2020-11-13 ENCOUNTER — Encounter: Payer: Self-pay | Admitting: Obstetrics and Gynecology

## 2020-11-13 ENCOUNTER — Other Ambulatory Visit: Payer: Self-pay

## 2020-11-13 ENCOUNTER — Other Ambulatory Visit: Payer: Self-pay | Admitting: Obstetrics and Gynecology

## 2020-11-13 VITALS — BP 107/68 | HR 74 | Wt 182.6 lb

## 2020-11-13 DIAGNOSIS — Z3A21 21 weeks gestation of pregnancy: Secondary | ICD-10-CM

## 2020-11-13 DIAGNOSIS — Z3482 Encounter for supervision of other normal pregnancy, second trimester: Secondary | ICD-10-CM

## 2020-11-13 NOTE — Progress Notes (Signed)
ROB: Patient took medication for BV and yeast and is now doing well.  Reports fetal movement.

## 2020-12-12 ENCOUNTER — Encounter: Payer: Self-pay | Admitting: Obstetrics and Gynecology

## 2020-12-12 ENCOUNTER — Other Ambulatory Visit: Payer: Self-pay

## 2020-12-12 ENCOUNTER — Ambulatory Visit (INDEPENDENT_AMBULATORY_CARE_PROVIDER_SITE_OTHER): Payer: Medicaid Other | Admitting: Obstetrics and Gynecology

## 2020-12-12 VITALS — BP 109/70 | HR 72 | Wt 186.8 lb

## 2020-12-12 DIAGNOSIS — Z3A25 25 weeks gestation of pregnancy: Secondary | ICD-10-CM

## 2020-12-12 DIAGNOSIS — Z3482 Encounter for supervision of other normal pregnancy, second trimester: Secondary | ICD-10-CM

## 2020-12-12 LAB — POCT URINALYSIS DIPSTICK OB
Bilirubin, UA: NEGATIVE
Blood, UA: NEGATIVE
Glucose, UA: NEGATIVE
Ketones, UA: NEGATIVE
Nitrite, UA: NEGATIVE
POC,PROTEIN,UA: NEGATIVE
Spec Grav, UA: 1.01 (ref 1.010–1.025)
Urobilinogen, UA: 0.2 E.U./dL
pH, UA: 8 (ref 5.0–8.0)

## 2020-12-12 NOTE — Patient Instructions (Addendum)
1-Hour Glucose  No dessert the night before No sweet drinks the day of- soda, fruit juice, sweet tea No sweet breakfast- pancakes, donuts May have mostly protein- egg, bacon, wheat toast, black coffee.               Grilled chicken, salad, vegetable, water.       3-Hour Glucose Test  Must be fasting.  Nothing to eat or drink after midnight.  May have morning medication with a sip of water.     Tests and Screening During Pregnancy Having certain tests and screenings during pregnancy is an important part of your prenatal care. These tests help your health care provider find problems that might affect your pregnancy. Some tests must be done for all pregnant women, and some are optional. Most of the tests and screenings do not pose any risks for you or your baby. You may need additional testing if any routine tests indicate a problem. Tests and screenings done early in pregnancy Some tests and screenings you can expect to have in early pregnancy include: Blood tests, such as: Complete blood count (CBC). This test is done to check your red and white blood cells. It can help identify a risk for anemia, infection, or bleeding. Blood typing. This test shows your blood type. It also shows whether you have a certain protein in your red blood cells called the Rh factor. It can be dangerous for your baby if you do not have this protein (Rh negative) and your baby has it (Rh positive). Tests to check for diseases that can cause birth defects or can be passed to your baby, such as: [place] measles (rubella) and chicken pox. The test indicates whether you are immune to these diseases. Hepatitis B and C. Human Immunodeficiency Virus (HIV). Syphilis. Zika virus, if you or your partner has traveled to an area where the virus occurs. Urine testing. This checks for sugar in your urine and for signs of infection. Blood pressure. This is to check for high blood pressure and preeclampsia. Testing for  sexually transmitted infections (STIs), such as chlamydia or gonorrhea. Testing for tuberculosis. You may have this skin test if you are at risk for tuberculosis. Fetal ultrasound. This is an imaging study of your growing baby. It uses sound waves to create pictures of your baby. This test may be done to help determine your due date and to ensure you do not have an ectopic pregnancy. An ectopic pregnancy is a pregnancy that grows outside of the uterus. Tests and screenings done later in pregnancy Certain tests are done for the first time later in the pregnancy. Some of the tests that were done in early pregnancy are repeated at this time. Some common tests you can expect to have later in pregnancy include: Rh antibody testing. If you are Rh negative, you will have a blood test at about 28 weeks of pregnancy to see if you are producing Rh antibodies. If you have not started to make antibodies, you will be given an injection to prevent you from making antibodies for the rest of your pregnancy. Glucose screening. This checks your blood sugar. It will show whether you are developing the type of diabetes that occurs during pregnancy (gestational diabetes). You may have this screening earlier if you have risk factors for diabetes. Screening for group B streptococcus (GBS). GBS is a type of bacteria that may live in your rectum or vagina. GBS can spread to your baby during birth. This is done  at 35-37 weeks of pregnancy. If testing is positive for GBS, you may be treated with antibiotic medicine. Urine and blood tests to monitor for other pregnancy problems, such as preeclampsia or anemia. Blood pressure to monitor for high blood pressure and preeclampsia. Fetal ultrasound. This may be repeated at 16-20 weeks to check how your baby is growing and developing. Non-stress test. This test is done later in pregnancy to check your baby's heart rate. This may be repeated weekly if your pregnancy is high  risk. Biophysical profile. This test includes ultrasound imaging and a non-stress test to ensure your baby is healthy. This test may help decide when your baby should be born. Screening for birth defects Some birth defects are caused by abnormal genes passed down through families. Early in your pregnancy, tests can be done to find out if your baby is at risk for a genetic disorder. This testing is optional. The type of testing recommended for you will depend on your family and medical history, your ethnicity, and your age. Testing may include: Screening tests. These tests may include an ultrasound, blood tests, or a combination of both. The blood tests are used to check for abnormal genes and the ultrasound is done to look for early birth defects. Carrier screening. This test involves checking the blood or saliva of both parents to see if they carry abnormal genes that could be passed down to a baby. If genetic screening shows that your baby is at risk for a genetic defect, additional diagnostic testing may be recommended, such as: Amniocentesis. This involves testing a sample of fluid from your womb (amniotic fluid). Chorionic villus sampling. In this test, a sample of cells from your placenta is checked for abnormal cells. Unlike other tests done during pregnancy, diagnostic testing does have some risk for your pregnancy. Talk to your health care provider about the risks and benefits of genetic testing. Questions to ask your health care provider What routine tests are recommended for me? When and how will these tests be done? When will I get the results of routine tests? What do the results of these tests mean for me or my baby? Do you recommend any genetic screening tests? Which ones? Should I see a genetic counselor before having genetic screening? Where to find more information American Pregnancy Association: americanpregnancy.org/prenatal-testing [employer]st[employer]ians and  Gynecologists: JewelryExec.com.pt Office on [employer] Health: KeywordPortfolios.com.br March of Dimes: marchofdimes.org/pregnancy Summary Having certain tests and screenings during pregnancy is an important part of your prenatal care. Talk to your health care provider about what tests are right for you and your baby. In early pregnancy, testing may be done to check your risks for various conditions that can affect you and your baby. Later in pregnancy, tests may be done to ensure that your baby is growing normally and that you and your baby are staying healthy during the pregnancy. Genetic testing is optional. Talk to your health care provider about the risks and benefits of genetic testing. This information is not intended to replace advice given to you by your health care provider. Make sure you discuss any questions you have with your health care provider. Document Revised: 12/18/2019 Document Reviewed: 12/18/2019 Elsevier Patient Education  [place].   Common Medications Safe in Pregnancy  Acne:      Constipation:  Benzoyl Peroxide     Colace  Clindamycin      Dulcolax Suppository  Topica Erythromycin     Fibercon  Salicylic Acid  Metamucil         Miralax AVOID:        Senakot   Accutane    Cough:  Retin-A       Cough Drops  Tetracycline      Phenergan w/ Codeine if Rx  Minocycline      Robitussin (Plain & DM)  Antibiotics:     Crabs/Lice:  Ceclor       RID  Cephalosporins    AVOID:  E-Mycins      Kwell  Keflex  Macrobid/Macrodantin   Diarrhea:  Penicillin      Kao-Pectate  Zithromax      Imodium AD         PUSH FLUIDS AVOID:       Cipro     Fever:  Tetracycline      Tylenol (Regular or Extra  Minocycline       Strength)  Levaquin      Extra Strength-Do not          Exceed 8 tabs/24 hrs Caffeine:        <235m/day (equiv. To 1 cup of coffee or  approx. 3 12 oz  sodas)         Gas: Cold/Hayfever:       Gas-X  Benadryl      Mylicon  Claritin       Phazyme  **Claritin-D        Chlor-Trimeton    Headaches:  Dimetapp      ASA-Free Excedrin  Drixoral-Non-Drowsy     Cold Compress  Mucinex (Guaifenasin)     Tylenol (Regular or Extra  Sudafed/Sudafed-12 Hour     Strength)  **Sudafed PE Pseudoephedrine   Tylenol Cold & Sinus     Vicks Vapor Rub  Zyrtec  **AVOID if Problems With Blood Pressure         Heartburn: Avoid lying down for at least 1 hour after meals  Aciphex      Maalox     Rash:  Milk of Magnesia     Benadryl    Mylanta       1% Hydrocortisone Cream  Pepcid  Pepcid Complete   Sleep Aids:  Prevacid      Ambien   Prilosec       Benadryl  Rolaids       Chamomile Tea  Tums (Limit 4/day)     Unisom         Tylenol PM         Warm milk-add vanilla or  Hemorrhoids:       Sugar for taste  Anusol/Anusol H.C.  (RX: Analapram 2.5%)  Sugar Substitutes:  Hydrocortisone OTC     Ok in moderation  Preparation H      Tucks        Vaseline lotion applied to tissue with wiping    Herpes:     Throat:  Acyclovir      Oragel  Famvir  Valtrex     Vaccines:         Flu Shot Leg Cramps:       *Gardasil  Benadryl      Hepatitis A         Hepatitis B Nasal Spray:       Pneumovax  Saline Nasal Spray     Polio Booster         Tetanus Nausea:       Tuberculosis test or PPD  Vitamin B6 25 mg TID   AVOID:  Dramamine      *Gardasil  Emetrol       Live Poliovirus  Ginger Root 250 mg QID    MMR (measles, mumps &  High Complex Carbs @ Bedtime    rebella)  Sea Bands-Accupressure    Varicella (Chickenpox)  Unisom 1/2 tab TID     *No known complications           If received before Pain:         Known pregnancy;   Darvocet       Resume series after  Lortab        Delivery  Percocet    Yeast:   Tramadol      Femstat  Tylenol 3      Gyne-lotrimin  Ultram       Monistat  Vicodin           MISC:         All Sunscreens           Hair  Coloring/highlights          Insect Repellant's          (Including DEET)         Mystic Tans

## 2020-12-12 NOTE — Progress Notes (Signed)
   OB-Pt present for routine prenatal care. Pt stated fetal movement present; no contractions present; no vaginal bleeding and no changes in vaginal discharge.     Pt c/o left pelvic pain and swollen feet/hands.  Pt was given 1 hour glucose information.

## 2020-12-12 NOTE — Progress Notes (Signed)
ROB: Notes swelling in hands and feet by the end of the day. Is consuming plenty of fluids, not eating many salty foods.  Also noting achy pain on left side, discussed home care measures. RTC in 4 weeks, for glucola at that time.

## 2021-01-08 ENCOUNTER — Encounter: Payer: Medicaid Other | Admitting: Obstetrics and Gynecology

## 2021-01-08 ENCOUNTER — Other Ambulatory Visit: Payer: Medicaid Other

## 2021-01-14 ENCOUNTER — Other Ambulatory Visit: Payer: Self-pay

## 2021-01-14 ENCOUNTER — Ambulatory Visit (INDEPENDENT_AMBULATORY_CARE_PROVIDER_SITE_OTHER): Payer: Medicaid Other | Admitting: Obstetrics and Gynecology

## 2021-01-14 ENCOUNTER — Encounter: Payer: Self-pay | Admitting: Obstetrics and Gynecology

## 2021-01-14 ENCOUNTER — Other Ambulatory Visit: Payer: Medicaid Other

## 2021-01-14 VITALS — BP 100/66 | HR 82 | Wt 193.0 lb

## 2021-01-14 DIAGNOSIS — Z23 Encounter for immunization: Secondary | ICD-10-CM

## 2021-01-14 DIAGNOSIS — Z3482 Encounter for supervision of other normal pregnancy, second trimester: Secondary | ICD-10-CM

## 2021-01-14 DIAGNOSIS — F32A Depression, unspecified: Secondary | ICD-10-CM

## 2021-01-14 DIAGNOSIS — Z3403 Encounter for supervision of normal first pregnancy, third trimester: Secondary | ICD-10-CM

## 2021-01-14 DIAGNOSIS — O99343 Other mental disorders complicating pregnancy, third trimester: Secondary | ICD-10-CM

## 2021-01-14 DIAGNOSIS — Z3A30 30 weeks gestation of pregnancy: Secondary | ICD-10-CM

## 2021-01-14 LAB — POCT URINALYSIS DIPSTICK OB
Bilirubin, UA: NEGATIVE
Blood, UA: NEGATIVE
Glucose, UA: NEGATIVE
Ketones, UA: NEGATIVE
Leukocytes, UA: NEGATIVE
Nitrite, UA: NEGATIVE
POC,PROTEIN,UA: NEGATIVE
Spec Grav, UA: 1.01 (ref 1.010–1.025)
Urobilinogen, UA: 0.2 E.U./dL
pH, UA: 7.5 (ref 5.0–8.0)

## 2021-01-14 MED ORDER — SERTRALINE HCL 50 MG PO TABS: 50.0000 mg | ORAL_TABLET | Freq: Every day | ORAL | 1 refills | Status: DC

## 2021-01-14 NOTE — Progress Notes (Signed)
ROB: She feels tired today, but no new concerns. 

## 2021-01-14 NOTE — Progress Notes (Signed)
ROB: Patient reports that she is "tired".  When questioned further she states that she cries every day and that she is just not herself.  She cannot pinpoint why she is so down but sometimes cannot seem to get out of it.  Also complains of difficulty sleeping because of constant thoughts in her mind racing.  We discussed the possibility of medication and she believes it may help as to why.  We will start Zoloft.  1 hour GCT today.

## 2021-01-15 LAB — CBC
Hematocrit: 33.6 % — ABNORMAL LOW (ref 34.0–46.6)
Hemoglobin: 11.7 g/dL (ref 11.1–15.9)
MCH: 28.7 pg (ref 26.6–33.0)
MCHC: 34.8 g/dL (ref 31.5–35.7)
MCV: 82 fL (ref 79–97)
Platelets: 193 10*3/uL (ref 150–450)
RBC: 4.08 x10E6/uL (ref 3.77–5.28)
RDW: 12.4 % (ref 11.7–15.4)
WBC: 9.6 10*3/uL (ref 3.4–10.8)

## 2021-01-15 LAB — HEPATITIS C ANTIBODY: Hep C Virus Ab: <0.1 s/co ratio (ref 0.0–0.9)

## 2021-01-15 LAB — GLUCOSE, 1 HOUR GESTATIONAL: Gestational Diabetes Screen: 101 mg/dL (ref 65–139)

## 2021-01-15 LAB — RPR: RPR Ser Ql: NONREACTIVE

## 2021-01-27 ENCOUNTER — Encounter: Payer: Medicaid Other | Admitting: Obstetrics and Gynecology

## 2021-01-27 NOTE — Patient Instructions (Signed)

## 2021-01-28 ENCOUNTER — Ambulatory Visit (INDEPENDENT_AMBULATORY_CARE_PROVIDER_SITE_OTHER): Payer: Medicaid Other | Admitting: Obstetrics and Gynecology

## 2021-01-28 ENCOUNTER — Other Ambulatory Visit: Payer: Self-pay

## 2021-01-28 ENCOUNTER — Encounter: Payer: Self-pay | Admitting: Obstetrics and Gynecology

## 2021-01-28 VITALS — BP 111/66 | HR 110 | Wt 194.9 lb

## 2021-01-28 DIAGNOSIS — Z3A32 32 weeks gestation of pregnancy: Secondary | ICD-10-CM

## 2021-01-28 DIAGNOSIS — Z3483 Encounter for supervision of other normal pregnancy, third trimester: Secondary | ICD-10-CM

## 2021-01-28 LAB — POCT URINALYSIS DIPSTICK OB
Bilirubin, UA: NEGATIVE
Blood, UA: NEGATIVE
Glucose, UA: NEGATIVE
Ketones, UA: NEGATIVE
Leukocytes, UA: NEGATIVE
Nitrite, UA: NEGATIVE
POC,PROTEIN,UA: NEGATIVE
Spec Grav, UA: 1.01 (ref 1.010–1.025)
Urobilinogen, UA: 0.2 E.U./dL
pH, UA: 6.5 (ref 5.0–8.0)

## 2021-01-28 MED ORDER — HYDROXYZINE HCL 25 MG PO TABS: 25.0000 mg | ORAL_TABLET | Freq: Every evening | ORAL | 2 refills | Status: DC | PRN

## 2021-01-28 NOTE — Progress Notes (Signed)
ROB: Patient notes trouble falling asleep. Has had issues sleeping since early parts of pregnancy, takes Unisom but not helping much anymore.  Has also tried Melatonin and Benadryl but does not help. Does note some mind racing. Can consider Atarax. Also noting [facility], and increased vaginal discharge (no odor or vaginal irrtation). RTC in 2 weeks.

## 2021-01-28 NOTE — Progress Notes (Signed)
   OB-Pt present for routine prenatal care. Pt stated fetal movement present; braxton hick contractions present; no vaginal bleeding and an increase in vaginal discharge clear white discharge everyday.     Pt c/o lower abd/vaginal pain and pressure.

## 2021-02-09 ENCOUNTER — Other Ambulatory Visit: Payer: Self-pay | Admitting: Obstetrics and Gynecology

## 2021-02-09 DIAGNOSIS — O99343 Other mental disorders complicating pregnancy, third trimester: Secondary | ICD-10-CM

## 2021-02-10 NOTE — Patient Instructions (Signed)

## 2021-02-11 ENCOUNTER — Other Ambulatory Visit: Payer: Self-pay

## 2021-02-11 ENCOUNTER — Ambulatory Visit (INDEPENDENT_AMBULATORY_CARE_PROVIDER_SITE_OTHER): Payer: Medicaid Other | Admitting: Obstetrics and Gynecology

## 2021-02-11 ENCOUNTER — Encounter: Payer: Self-pay | Admitting: Obstetrics and Gynecology

## 2021-02-11 VITALS — BP 108/73 | HR 88 | Wt 193.4 lb

## 2021-02-11 DIAGNOSIS — Z3A36 36 weeks gestation of pregnancy: Secondary | ICD-10-CM

## 2021-02-11 DIAGNOSIS — Z3483 Encounter for supervision of other normal pregnancy, third trimester: Secondary | ICD-10-CM

## 2021-02-11 LAB — POCT URINALYSIS DIPSTICK OB
Bilirubin, UA: NEGATIVE
Blood, UA: NEGATIVE
Glucose, UA: NEGATIVE
Ketones, UA: NEGATIVE
Leukocytes, UA: NEGATIVE
Nitrite, UA: NEGATIVE
POC,PROTEIN,UA: NEGATIVE
Spec Grav, UA: <=1.005 — AB (ref 1.010–1.025)
Urobilinogen, UA: 0.2 E.U./dL
pH, UA: 7.5 (ref 5.0–8.0)

## 2021-02-11 NOTE — Progress Notes (Signed)
ROB: Patient says she is doing well.  Complains of increased discharge.  Discharge is asymptomatic.  She would like to be checked.  Denies concerns for STDs. Sterile speculum exam performed  -cervix appears soft but closed. WET PREP: clue cells: absent, KOH (yeast): negative, odor: absent, trichomoniasis: negative and Nitrazine and fern negative Ph:  < 4.5

## 2021-02-11 NOTE — Progress Notes (Signed)
OB-Pt present for routine prenatal care. Pt reported having pain and pressure in lower abd area, braxton hick contractions and a lot of watery discharge.

## 2021-02-13 ENCOUNTER — Encounter: Payer: Medicaid Other | Admitting: Obstetrics and Gynecology

## 2021-02-21 ENCOUNTER — Other Ambulatory Visit: Payer: Self-pay | Admitting: Obstetrics and Gynecology

## 2021-02-25 ENCOUNTER — Encounter: Payer: Self-pay | Admitting: Obstetrics and Gynecology

## 2021-02-25 ENCOUNTER — Telehealth: Payer: Self-pay | Admitting: Obstetrics and Gynecology

## 2021-02-25 ENCOUNTER — Ambulatory Visit (INDEPENDENT_AMBULATORY_CARE_PROVIDER_SITE_OTHER): Payer: Medicaid Other | Admitting: Obstetrics and Gynecology

## 2021-02-25 ENCOUNTER — Other Ambulatory Visit: Payer: Self-pay

## 2021-02-25 VITALS — BP 116/80 | HR 111 | Wt 196.1 lb

## 2021-02-25 DIAGNOSIS — O368130 Decreased fetal movements, third trimester, not applicable or unspecified: Secondary | ICD-10-CM

## 2021-02-25 DIAGNOSIS — F411 Generalized anxiety disorder: Secondary | ICD-10-CM

## 2021-02-25 DIAGNOSIS — Z8619 Personal history of other infectious and parasitic diseases: Secondary | ICD-10-CM

## 2021-02-25 DIAGNOSIS — F32A Depression, unspecified: Secondary | ICD-10-CM

## 2021-02-25 DIAGNOSIS — Z3A36 36 weeks gestation of pregnancy: Secondary | ICD-10-CM | POA: Diagnosis not present

## 2021-02-25 DIAGNOSIS — Z3483 Encounter for supervision of other normal pregnancy, third trimester: Secondary | ICD-10-CM

## 2021-02-25 DIAGNOSIS — O99343 Other mental disorders complicating pregnancy, third trimester: Secondary | ICD-10-CM

## 2021-02-25 LAB — FETAL NONSTRESS TEST

## 2021-02-25 LAB — POCT URINALYSIS DIPSTICK OB
Bilirubin, UA: NEGATIVE
Blood, UA: NEGATIVE
Glucose, UA: NEGATIVE
Ketones, UA: NEGATIVE
Leukocytes, UA: NEGATIVE
Nitrite, UA: NEGATIVE
POC,PROTEIN,UA: NEGATIVE
Spec Grav, UA: 1.015 (ref 1.010–1.025)
Urobilinogen, UA: 0.2 E.U./dL
pH, UA: 7 (ref 5.0–8.0)

## 2021-02-25 MED ORDER — VALACYCLOVIR HCL 500 MG PO TABS: 500.0000 mg | ORAL_TABLET | Freq: Two times a day (BID) | ORAL | 1 refills | Status: DC

## 2021-02-25 MED ORDER — SERTRALINE HCL 100 MG PO TABS: 100.0000 mg | ORAL_TABLET | Freq: Every day | ORAL | 3 refills | Status: DC

## 2021-02-25 NOTE — Progress Notes (Signed)
ROB: She has been having some pressure and Braxton Hick's off and on. Swelling in her extremities at night that cause numbness.

## 2021-02-25 NOTE — Telephone Encounter (Signed)
Pt came in to her appointment at 8:34 a.m with a child under the age of 45  and when asked the questions she walked out and did not want to answer any questions that was asked of her .  Pt walked back in the office w/out child and want to be seen at 8:42 okay per Crystal B.

## 2021-02-25 NOTE — Progress Notes (Signed)
ROB: Patient still noting depression symptoms despite being on Zoloft.  Tearful in office today. Discussed increasing dose to 100 mg, also recommended counseling. Reports that her anxiety is better in the evenings taking the Atarax (takes 2 tabs).  PHQ score is 15, GAD score is 17. Notes that for the past week she has not been feeling much fetal movement.  Also notes decrease in appetite but has been drinking sodas every now and then to see if her baby will move. Discussed small snacks vs large meals.  NST performed today was reviewed and was found to be reactive.  Continue recommended antenatal testing and prenatal care. 36 week cultures done. To begin HSV prophylaxis today. RTC in 1 week.     NONSTRESS TEST INTERPRETATION  INDICATIONS: Decreased fetal movement  FHR baseline: 150 bpm RESULTS:Reactive COMMENTS: Uterine irritability   PLAN: 1. Continue fetal kick counts twice a day.

## 2021-02-25 NOTE — Patient Instructions (Signed)
Fetal Movement Counts Patient Name: ________________________________________________ Patient DueDate: ____________________ What is a fetal movement count?  A fetal movement count is the number of times that you feel your baby move during a certain amount of time. This may also be called a fetal kick count. A fetal movement count is recommended for every pregnant woman. You may be askedto start counting fetal movements as early as week 28 of your pregnancy. Pay attention to when your baby is most active. You may notice your baby's sleep and wake cycles. You may also notice things that make your baby move more. You should do a fetal movement count: When your baby is normally most active. At the same time each day. A good time to count movements is while you are resting, after having somethingto eat and drink. How do I count fetal movements? Find a quiet, comfortable area. Sit, or lie down on your side. Write down the date, the start time and stop time, and the number of movements that you felt between those two times. Take this information with you to your health care visits. Write down your start time when you feel the first movement. Count kicks, flutters, swishes, rolls, and jabs. You should feel at least 10 movements. You may stop counting after you have felt 10 movements, or if you have been counting for 2 hours. Write down the stop time. If you do not feel 10 movements in 2 hours, contact your health care provider for further instructions. Your health care provider may want to do additional tests to assess your baby's well-being. Contact a health care provider if: You feel fewer than 10 movements in 2 hours. Your baby is not moving like he or she usually does. Date: ____________ Start time: ____________ Stop time: ____________ Movements:____________ Date: ____________ Start time: ____________ Stop time: ____________ Movements:____________ Date: ____________ Start time: ____________ Stop  time: ____________ Movements:____________ Date: ____________ Start time: ____________ Stop time: ____________ Movements:____________ Date: ____________ Start time: ____________ Stop time: ____________ Movements:____________ Date: ____________ Start time: ____________ Stop time: ____________ Movements:____________ Date: ____________ Start time: ____________ Stop time: ____________ Movements:____________ Date: ____________ Start time: ____________ Stop time: ____________ Movements:____________ Date: ____________ Start time: ____________ Stop time: ____________ Movements:____________ This information is not intended to replace advice given to you by your health care provider. Make sure you discuss any questions you have with your healthcare provider. Document Revised: 11/16/2018 Document Reviewed: 11/16/2018 Elsevier Patient Education  2022 [employer]  

## 2021-02-27 LAB — STREP GP B NAA: Strep Gp B NAA: NEGATIVE

## 2021-03-01 LAB — GC/CHLAMYDIA PROBE AMP
Chlamydia trachomatis, NAA: NEGATIVE
Neisseria Gonorrhoeae by PCR: NEGATIVE

## 2021-03-02 ENCOUNTER — Encounter: Payer: Self-pay | Admitting: Obstetrics and Gynecology

## 2021-03-04 ENCOUNTER — Encounter: Payer: Self-pay | Admitting: Obstetrics and Gynecology

## 2021-03-04 ENCOUNTER — Ambulatory Visit (INDEPENDENT_AMBULATORY_CARE_PROVIDER_SITE_OTHER): Payer: Medicaid Other | Admitting: Obstetrics and Gynecology

## 2021-03-04 ENCOUNTER — Other Ambulatory Visit: Payer: Self-pay

## 2021-03-04 VITALS — BP 109/76 | HR 109 | Wt 197.3 lb

## 2021-03-04 DIAGNOSIS — Z3A37 37 weeks gestation of pregnancy: Secondary | ICD-10-CM

## 2021-03-04 DIAGNOSIS — Z3483 Encounter for supervision of other normal pregnancy, third trimester: Secondary | ICD-10-CM

## 2021-03-04 LAB — POCT URINALYSIS DIPSTICK OB
Bilirubin, UA: NEGATIVE
Blood, UA: NEGATIVE
Glucose, UA: NEGATIVE
Ketones, UA: NEGATIVE
Leukocytes, UA: NEGATIVE
Nitrite, UA: NEGATIVE
Spec Grav, UA: 1.02 (ref 1.010–1.025)
Urobilinogen, UA: 0.2 E.U./dL
pH, UA: 6 (ref 5.0–8.0)

## 2021-03-04 NOTE — Progress Notes (Signed)
ROB: She was having some contractions yesterday. Has not felt baby move as much today until now.

## 2021-03-04 NOTE — Progress Notes (Signed)
ROB: Had contractions yesterday but she reports they went away and none today.  Describes decreased fetal movement earlier today but now baby very active.  She reports her mood is much improved and she has just decided to "make the best of it".  She seems calm and well-adjusted today.  She is taking the Zoloft 100 mg daily.

## 2021-03-11 ENCOUNTER — Other Ambulatory Visit: Payer: Self-pay

## 2021-03-11 ENCOUNTER — Ambulatory Visit (INDEPENDENT_AMBULATORY_CARE_PROVIDER_SITE_OTHER): Payer: Medicaid Other | Admitting: Obstetrics and Gynecology

## 2021-03-11 VITALS — BP 120/82 | HR 96 | Wt 198.0 lb

## 2021-03-11 DIAGNOSIS — Z3483 Encounter for supervision of other normal pregnancy, third trimester: Secondary | ICD-10-CM

## 2021-03-11 DIAGNOSIS — Z3A38 38 weeks gestation of pregnancy: Secondary | ICD-10-CM

## 2021-03-11 DIAGNOSIS — T4995XA Adverse effect of unspecified topical agent, initial encounter: Secondary | ICD-10-CM

## 2021-03-11 DIAGNOSIS — R238 Other skin changes: Secondary | ICD-10-CM

## 2021-03-11 LAB — POCT URINALYSIS DIPSTICK OB
Bilirubin, UA: NEGATIVE
Blood, UA: NEGATIVE
Glucose, UA: NEGATIVE
Ketones, UA: NEGATIVE
Leukocytes, UA: NEGATIVE
Nitrite, UA: NEGATIVE
POC,PROTEIN,UA: NEGATIVE
Spec Grav, UA: 1.015 (ref 1.010–1.025)
Urobilinogen, UA: 0.2 E.U./dL
pH, UA: 7.5 (ref 5.0–8.0)

## 2021-03-11 NOTE — Progress Notes (Signed)
ROB: Overall notes mood is still doing well.  Reports possible skin burn due use of hair removal product in vaginal region last week. Has been using hydrocortisone cream but still noting some irritation. Exam notes mild skin irritation in left groin. Advised on use of coconut oil and aloe mixture along with the hydrocortisone. Discussed labor precautions. RTC in 1 week.

## 2021-03-11 NOTE — Progress Notes (Signed)
ROB: Patient has no new concerns and is feeling well today. 

## 2021-03-18 ENCOUNTER — Other Ambulatory Visit: Payer: Self-pay

## 2021-03-18 ENCOUNTER — Encounter: Payer: Self-pay | Admitting: Obstetrics and Gynecology

## 2021-03-18 ENCOUNTER — Ambulatory Visit (INDEPENDENT_AMBULATORY_CARE_PROVIDER_SITE_OTHER): Payer: Medicaid Other | Admitting: Obstetrics and Gynecology

## 2021-03-18 VITALS — BP 130/89 | HR 104 | Wt 201.4 lb

## 2021-03-18 DIAGNOSIS — K649 Unspecified hemorrhoids: Secondary | ICD-10-CM

## 2021-03-18 DIAGNOSIS — Z3483 Encounter for supervision of other normal pregnancy, third trimester: Secondary | ICD-10-CM

## 2021-03-18 DIAGNOSIS — Z3A39 39 weeks gestation of pregnancy: Secondary | ICD-10-CM

## 2021-03-18 LAB — POCT URINALYSIS DIPSTICK OB
Bilirubin, UA: NEGATIVE
Glucose, UA: NEGATIVE
Ketones, UA: NEGATIVE
Leukocytes, UA: NEGATIVE
Nitrite, UA: NEGATIVE
Spec Grav, UA: 1.025 (ref 1.010–1.025)
Urobilinogen, UA: 0.2 E.U./dL
pH, UA: 6.5 (ref 5.0–8.0)

## 2021-03-18 MED ORDER — HYDROCORTISONE ACETATE 25 MG RE SUPP: 25.0000 mg | Freq: Two times a day (BID) | RECTAL | 0 refills | Status: DC

## 2021-03-18 NOTE — Progress Notes (Signed)
ROB: Occasional contractions.  Very uncomfortable from hemorrhoids.-  Anusol PR prescribed.  Patient advised on how to keep her stool soft using Citrucel.  Discussed daily fetal movement.  BPP Tuesday.  Induction scheduled for Thursday at midnight.  COVID testing next Tuesday.  Signs and symptoms of labor discussed.

## 2021-03-18 NOTE — Progress Notes (Signed)
ROB: She has hemorrhoids that are really bothering her. She wants baby out.

## 2021-03-18 NOTE — Addendum Note (Signed)
Addended by: Elonda Husky on: 03/18/2021 10:18 AM   Modules accepted: Orders

## 2021-03-23 ENCOUNTER — Encounter: Payer: Self-pay | Admitting: Obstetrics and Gynecology

## 2021-03-23 ENCOUNTER — Other Ambulatory Visit: Payer: Self-pay

## 2021-03-23 ENCOUNTER — Inpatient Hospital Stay
Admission: EM | Admit: 2021-03-23 | Discharge: 2021-03-24 | DRG: 807 | Disposition: A | Discharge status: Home / Self Care | Payer: Medicaid Other | Attending: Obstetrics and Gynecology | Admitting: Obstetrics and Gynecology

## 2021-03-23 DIAGNOSIS — O3663X0 Maternal care for excessive fetal growth, third trimester, not applicable or unspecified: Secondary | ICD-10-CM | POA: Diagnosis present

## 2021-03-23 DIAGNOSIS — O480 Post-term pregnancy: Secondary | ICD-10-CM | POA: Diagnosis present

## 2021-03-23 DIAGNOSIS — O99345 Other mental disorders complicating the puerperium: Secondary | ICD-10-CM | POA: Diagnosis not present

## 2021-03-23 DIAGNOSIS — O99344 Other mental disorders complicating childbirth: Secondary | ICD-10-CM | POA: Diagnosis present

## 2021-03-23 DIAGNOSIS — F32A Depression, unspecified: Secondary | ICD-10-CM | POA: Diagnosis present

## 2021-03-23 DIAGNOSIS — Z20822 Contact with and (suspected) exposure to covid-19: Secondary | ICD-10-CM | POA: Diagnosis present

## 2021-03-23 DIAGNOSIS — Z3A40 40 weeks gestation of pregnancy: Secondary | ICD-10-CM | POA: Diagnosis not present

## 2021-03-23 LAB — TYPE AND SCREEN
ABO/RH(D): B POS
Antibody Screen: NEGATIVE

## 2021-03-23 LAB — RESP PANEL BY RT-PCR (FLU A&B, COVID) ARPGX2
Influenza A by PCR: NEGATIVE
Influenza B by PCR: NEGATIVE
SARS Coronavirus 2 by RT PCR: NEGATIVE

## 2021-03-23 LAB — CBC
HCT: 39.7 % (ref 36.0–46.0)
Hemoglobin: 13.4 g/dL (ref 12.0–15.0)
MCH: 27.9 pg (ref 26.0–34.0)
MCHC: 33.8 g/dL (ref 30.0–36.0)
MCV: 82.7 fL (ref 80.0–100.0)
Platelets: 191 10*3/uL (ref 150–400)
RBC: 4.8 MIL/uL (ref 3.87–5.11)
RDW: 13.7 % (ref 11.5–15.5)
WBC: 10.6 10*3/uL — ABNORMAL HIGH (ref 4.0–10.5)
nRBC: 0 % (ref 0.0–0.2)

## 2021-03-23 LAB — RPR: RPR Ser Ql: NONREACTIVE

## 2021-03-23 MED ORDER — AMMONIA AROMATIC IN INHA
RESPIRATORY_TRACT | Status: AC
  Filled 2021-03-23: qty 10

## 2021-03-23 MED ORDER — SIMETHICONE 80 MG PO CHEW: 80.0000 mg | CHEWABLE_TABLET | ORAL | Status: DC | PRN

## 2021-03-23 MED ORDER — LIDOCAINE HCL (PF) 1 % IJ SOLN
30.0000 mL | INTRAMUSCULAR | Status: DC | PRN
  Filled 2021-03-23: qty 30

## 2021-03-23 MED ORDER — OXYTOCIN BOLUS FROM INFUSION
333.0000 mL | Freq: Once | INTRAVENOUS | Status: AC
  Administered 2021-03-23: 333 mL via INTRAVENOUS

## 2021-03-23 MED ORDER — LACTATED RINGERS IV SOLN: 500.0000 mL | INTRAVENOUS | Status: DC | PRN

## 2021-03-23 MED ORDER — OXYTOCIN-SODIUM CHLORIDE 30-0.9 UT/500ML-% IV SOLN: 2.5000 [IU]/h | INTRAVENOUS | Status: DC | PRN

## 2021-03-23 MED ORDER — ZOLPIDEM TARTRATE 5 MG PO TABS: 5.0000 mg | ORAL_TABLET | Freq: Every evening | ORAL | Status: DC | PRN

## 2021-03-23 MED ORDER — ONDANSETRON HCL 4 MG/2ML IJ SOLN: 4.0000 mg | Freq: Four times a day (QID) | INTRAMUSCULAR | Status: DC | PRN

## 2021-03-23 MED ORDER — SOD CITRATE-CITRIC ACID 500-334 MG/5ML PO SOLN: 30.0000 mL | ORAL | Status: DC | PRN

## 2021-03-23 MED ORDER — LACTATED RINGERS IV SOLN: INTRAVENOUS | Status: DC

## 2021-03-23 MED ORDER — PRENATAL MULTIVITAMIN CH
1.0000 | ORAL_TABLET | Freq: Every day | ORAL | Status: DC
  Administered 2021-03-23 – 2021-03-24 (×2): 1 via ORAL
  Filled 2021-03-23 (×2): qty 1

## 2021-03-23 MED ORDER — ACETAMINOPHEN 325 MG PO TABS
650.0000 mg | ORAL_TABLET | ORAL | Status: DC | PRN
  Filled 2021-03-23: qty 2

## 2021-03-23 MED ORDER — BUTORPHANOL TARTRATE 1 MG/ML IJ SOLN: 1.0000 mg | INTRAMUSCULAR | Status: DC | PRN

## 2021-03-23 MED ORDER — OXYTOCIN-SODIUM CHLORIDE 30-0.9 UT/500ML-% IV SOLN
INTRAVENOUS | Status: AC
  Filled 2021-03-23: qty 500

## 2021-03-23 MED ORDER — OXYCODONE-ACETAMINOPHEN 5-325 MG PO TABS: 1.0000 | ORAL_TABLET | ORAL | Status: DC | PRN

## 2021-03-23 MED ORDER — OXYTOCIN 10 UNIT/ML IJ SOLN
INTRAMUSCULAR | Status: AC
  Filled 2021-03-23: qty 2

## 2021-03-23 MED ORDER — TETANUS-DIPHTH-ACELL PERTUSSIS 5-2.5-18.5 LF-MCG/0.5 IM SUSY
0.5000 mL | PREFILLED_SYRINGE | Freq: Once | INTRAMUSCULAR | Status: DC
  Filled 2021-03-23: qty 0.5

## 2021-03-23 MED ORDER — DIPHENHYDRAMINE HCL 25 MG PO CAPS: 25.0000 mg | ORAL_CAPSULE | Freq: Four times a day (QID) | ORAL | Status: DC | PRN

## 2021-03-23 MED ORDER — DOCUSATE SODIUM 100 MG PO CAPS
100.0000 mg | ORAL_CAPSULE | Freq: Two times a day (BID) | ORAL | Status: DC
  Administered 2021-03-24 (×2): 100 mg via ORAL
  Filled 2021-03-23 (×2): qty 1

## 2021-03-23 MED ORDER — IBUPROFEN 600 MG PO TABS
600.0000 mg | ORAL_TABLET | Freq: Four times a day (QID) | ORAL | Status: DC
  Administered 2021-03-23 – 2021-03-24 (×5): 600 mg via ORAL
  Filled 2021-03-23 (×5): qty 1

## 2021-03-23 MED ORDER — ACETAMINOPHEN 325 MG PO TABS
650.0000 mg | ORAL_TABLET | ORAL | Status: DC | PRN
  Administered 2021-03-23 (×2): 650 mg via ORAL
  Filled 2021-03-23: qty 2

## 2021-03-23 MED ORDER — MISOPROSTOL 200 MCG PO TABS
ORAL_TABLET | ORAL | Status: AC
  Filled 2021-03-23: qty 4

## 2021-03-23 MED ORDER — BENZOCAINE-MENTHOL 20-0.5 % EX AERO: 1.0000 "application " | INHALATION_SPRAY | CUTANEOUS | Status: DC | PRN

## 2021-03-23 MED ORDER — LIDOCAINE HCL (PF) 1 % IJ SOLN
INTRAMUSCULAR | Status: AC
  Filled 2021-03-23: qty 30

## 2021-03-23 MED ORDER — OXYTOCIN-SODIUM CHLORIDE 30-0.9 UT/500ML-% IV SOLN
2.5000 [IU]/h | INTRAVENOUS | Status: DC
  Administered 2021-03-23: 2.5 [IU]/h via INTRAVENOUS

## 2021-03-23 NOTE — Lactation Note (Signed)
This note was copied from a baby's chart. Lactation Consultation Note  Patient Name: Angela Fox YFVCB'S Date: 03/23/2021 Reason for consult: Follow-up assessment;Term Age:33 hours  Lactation follow-up per mom's request. Baby was larger than 4000g, stable glucose x2. Mom reports baby is awake and alert and she would like help with positioning and latching. LC present at bedside, baby awake and alert but not upset. LC talked mom through positioning baby, moving of arm, and sandwiching of breast tissue. Baby turned in towards mom in cradle hold, grasped breast easily, initially had bottom lip tucked under, discomfort but LC flanged out lip and mom had immediate relief. Baby had rhythmic sucking pattern and both mom and LC able to hear audible swallows. Provided tips on how to keep baby awake at the breast for full feeding, and signs for when baby has finished eating.  Maternal Data Has patient been taught Hand Expression?: Yes Does the patient have breastfeeding experience prior to this delivery?: Yes  Feeding Mother's Current Feeding Choice: Breast Milk  LATCH Score Latch: Grasps breast easily, tongue down, lips flanged, rhythmical sucking.  Audible Swallowing: Spontaneous and intermittent  Type of Nipple: Everted at rest and after stimulation  Comfort (Breast/Nipple): Soft / non-tender  Hold (Positioning): Assistance needed to correctly position infant at breast and maintain latch.  LATCH Score: 9   Lactation Tools Discussed/Used    Interventions Interventions: Assisted with latch;Hand express;Adjust position;Support pillows;Education  Discharge    Consult Status Consult Status: PRN Date: 03/23/21 Follow-up type: Call as needed    [name] 03/23/2021, 3:01 PM

## 2021-03-23 NOTE — H&P (Signed)
History and Physical   HPI  Angela Fox is a 35 y.o. [id number] at [redacted]w[redacted]d Estimated Date of Delivery: 03/23/21 who is being admitted for advanced labor at term.   OB History  OB History  Gravida Para Term Preterm AB Living  4 3 3  0 1 3  SAB IAB Ectopic Multiple Live Births  1 0 0 0 3    # Outcome Date GA Lbr Len/2nd Weight Sex Delivery Anes PTL Lv  4 Term 03/23/21 [redacted]w[redacted]d / 00:44  M Vag-Spont None  LIV     Name: Crandle,BOY [place]a     Apgar1: 8  Apgar5: 9  3 SAB 2019          2 Term 09/22/16   3175 g F Vag-Spont EPI N LIV     Name: 09/24/16  1 Term 10/05/07 [redacted]w[redacted]d  2892 g M Vag-Spont EPI N LIV     Name: [id number]    PROBLEM LIST  Pregnancy complications or risks: Patient Active Problem List   Diagnosis Date Noted   Post-dates pregnancy 03/23/2021   History of maternal gonorrhea, currently pregnant in second trimester 10/17/2020   Irregular menses 03/20/2019   Major depressive disorder, single episode, moderate (HCC) 02/27/2018   Generalized anxiety disorder 02/27/2018   History of herpes genitalis 08/19/2016    Prenatal labs and studies: ABO, Rh: --/--/B POS (12/12 08-11-1980) Antibody: NEG (12/12 0833) Rubella: 1.20 (05/27 0950) RPR: Non Reactive (10/05 1038)  HBsAg: Negative (05/27 0950)  HIV: Non Reactive (05/27 0950)  08-26-1973-- (11/16 1336)   Past Medical History:  Diagnosis Date   Abnormal Pap smear of cervix    Amenorrhea    Cervical dysplasia    Required LEEP   Endometriosis    Generalized anxiety disorder 02/27/2018   Gonorrhea 09/25/2020   Herpes genitalis    Major depressive disorder, single episode, moderate (HCC) 02/27/2018     Past Surgical History:  Procedure Laterality Date   CERVICAL BIOPSY  W/ LOOP ELECTRODE EXCISION     pt was 15-16yo   LEEP       Medications    Current Discharge Medication List     CONTINUE these medications which have NOT CHANGED   Details  Doxylamine-Pyridoxine (DICLEGIS) 10-10 MG TBEC Take 2 tablets by mouth  at bedtime. If symptoms persist, add one tablet in the morning and one in the afternoon Qty: 100 tablet, Refills: 5    hydrOXYzine (ATARAX/VISTARIL) 25 MG tablet Take 1 tablet (25 mg total) by mouth at bedtime as needed for anxiety (can increase to 2 tablets at night after 1 week if no relief.). Qty: 30 tablet, Refills: 2    Prenatal Vit-Fe Fumarate-FA (MULTIVITAMIN-PRENATAL) 27-0.8 MG TABS tablet Take 1 tablet by mouth daily at 12 noon.   Associated Diagnoses: Prenatal care in first trimester    sertraline (ZOLOFT) 100 MG tablet Take 1 tablet (100 mg total) by mouth daily. Qty: 90 tablet, Refills: 3    valACYclovir (VALTREX) 500 MG tablet Take 1 tablet (500 mg total) by mouth 2 (two) times daily. Qty: 30 tablet, Refills: 1    acyclovir (ZOVIRAX) 400 MG tablet TAKE 1 TABLET BY MOUTH TWICE A DAY Qty: 60 tablet, Refills: 0    hydrocortisone (ANUSOL-HC) 25 MG suppository Place 1 suppository (25 mg total) rectally 2 (two) times daily. Qty: 12 suppository, Refills: 0   Associated Diagnoses: Hemorrhoids, unspecified hemorrhoid type         Allergies  Patient has no known allergies.  Review of Systems  Pertinent items noted in HPI and remainder of comprehensive ROS otherwise negative.  Physical Exam  BP 109/71 (BP Location: Left Arm)   Pulse 89   Temp 97.9 F (36.6 C) (Oral)   Resp 18   Ht 5\' 11"  (1.803 m)   Wt 91.2 kg   LMP 06/16/2020   Breastfeeding Unknown   BMI 28.03 kg/m   Lungs:  CTA B Cardio: RRR without M/R/G Abd: Soft, gravid, NT Presentation: cephalic EXT: No C/C/ 1+ Edema DTRs: 2+ B CERVIX: Dilation: 10 Dilation Complete Date: 03/23/21 Dilation Complete Time: 0905 Effacement (%): 100 Exam by:: [email or web address], RN  See Prenatal records for more detailed PE.     FHR:  Variability: Good {> 6 bpm)  Toco: Uterine Contractions: Q4 min  Test Results  Results for orders placed or performed during the hospital encounter of 03/23/21 (from the past 24  hour(s))  Resp Panel by RT-PCR (Flu A&B, Covid) Nasopharyngeal Swab     Status: None   Collection Time: 03/23/21  8:22 AM   Specimen: Nasopharyngeal Swab; Nasopharyngeal(NP) swabs in vial transport medium  Result Value Ref Range   SARS Coronavirus 2 by RT PCR NEGATIVE NEGATIVE   Influenza A by PCR NEGATIVE NEGATIVE   Influenza B by PCR NEGATIVE NEGATIVE  CBC     Status: Abnormal   Collection Time: 03/23/21  8:33 AM  Result Value Ref Range   WBC 10.6 (H) 4.0 - 10.5 K/uL   RBC 4.80 3.87 - 5.11 MIL/uL   Hemoglobin 13.4 12.0 - 15.0 g/dL   HCT 14/12/22 86.7 - 61.9 %   MCV 82.7 80.0 - 100.0 fL   MCH 27.9 26.0 - 34.0 pg   MCHC 33.8 30.0 - 36.0 g/dL   RDW 50.9 32.6 - 71.2 %   Platelets 191 150 - 400 K/uL   nRBC 0.0 0.0 - 0.2 %  Type and screen     Status: None   Collection Time: 03/23/21  8:33 AM  Result Value Ref Range   ABO/RH(D) B POS    Antibody Screen NEG    Sample Expiration      03/26/2021,2359 Performed at [facility], [address] [place], [place]    Group B Strep negative  Assessment   [address] at [redacted]w[redacted]d Estimated Date of Delivery: 03/23/21  The fetus is reassuring.   Patient Active Problem List   Diagnosis Date Noted   Post-dates pregnancy 03/23/2021   History of maternal gonorrhea, currently pregnant in second trimester 10/17/2020   Irregular menses 03/20/2019   Major depressive disorder, single episode, moderate (HCC) 02/27/2018   Generalized anxiety disorder 02/27/2018   History of herpes genitalis 08/19/2016    Plan  1. Admit to L&D :   2. EFM: -- Category 1 3. Stadol or Epidural if desired.   4. Admission labs  5. Expect vaginal delivery  10/19/2016, M.D. 03/23/2021 11:40 AM

## 2021-03-23 NOTE — Lactation Note (Signed)
This note was copied from a baby's chart. Lactation Consultation Note  Patient Name: Angela Fox KFEXM'D Date: 03/23/2021 Reason for consult: L&D Initial assessment;Term Age:33 hours  Initial lactation visit in LDR2. Mom is P3, SVD < 1 hour ago. Mom reports difficulties in the past with breastfeeding due to low milk supply and eventual need for supplementation. Mom desires to attempt to breastfeed with Pres and open to supplementation as needed. LC adjusted baby, assisted with latch. Baby grasped the breast easily; strong rhythmic sucking pattern and occasional identifiable swallows. Baby fed well for 27 minutes. Mom's nipple was round, slightly red after he came off. LC provided education for: newborn feeding patterns and behaviors in first 24 hours, feeding with cues/on demand, reviewed early cues, benefits of skin to skin, position and latch of baby and flanged top/bottom lips, and output expectations.  LC and mom discussed normal course of lactation, milk supply and demand, and growing supply. Mom has some experience with pre-pumping and receiving colostrum; discussed possibly pumping post feedings to encourage more production- mom open to this. LC to follow-up on MBU.  Maternal Data Has patient been taught Hand Expression?: Yes Does the patient have breastfeeding experience prior to this delivery?: Yes How long did the patient breastfeed?: 1 month, 2 weeks  Feeding Mother's Current Feeding Choice: Breast Milk  LATCH Score Latch: Grasps breast easily, tongue down, lips flanged, rhythmical sucking.  Audible Swallowing: A few with stimulation  Type of Nipple: Everted at rest and after stimulation  Comfort (Breast/Nipple): Soft / non-tender  Hold (Positioning): Assistance needed to correctly position infant at breast and maintain latch.  LATCH Score: 8   Lactation Tools Discussed/Used    Interventions Interventions: Breast feeding basics reviewed;Assisted with latch;Hand  express;Position options;Adjust position;Education  Discharge    Consult Status Consult Status: Follow-up from L&D Date: 03/23/21 Follow-up type: In-patient    [name] 03/23/2021, 11:05 AM

## 2021-03-24 ENCOUNTER — Other Ambulatory Visit: Payer: Medicaid Other

## 2021-03-24 ENCOUNTER — Encounter: Payer: Self-pay | Admitting: Obstetrics and Gynecology

## 2021-03-24 ENCOUNTER — Encounter: Payer: Medicaid Other | Admitting: Obstetrics and Gynecology

## 2021-03-24 DIAGNOSIS — Z3483 Encounter for supervision of other normal pregnancy, third trimester: Secondary | ICD-10-CM

## 2021-03-24 DIAGNOSIS — Z3A40 40 weeks gestation of pregnancy: Secondary | ICD-10-CM

## 2021-03-24 MED ORDER — IBUPROFEN 600 MG PO TABS: 600.0000 mg | ORAL_TABLET | Freq: Four times a day (QID) | ORAL | 1 refills | Status: DC | PRN

## 2021-03-24 NOTE — Progress Notes (Signed)
Post Partum Day # 1, s/p SVD  Subjective: no complaints, up ad lib, voiding, and tolerating PO. Breastfeeding is going well. Pain controlled with medications. Notes bleeding was a little heavier than normal but is now slowing.   Objective: Temp:  [97.9 F (36.6 C)-98.9 F (37.2 C)] 98.2 F (36.8 C) (12/13 0755) Pulse Rate:  [70-98] 70 (12/13 0755) Resp:  [18-20] 20 (12/13 0755) BP: (105-131)/(74-92) 105/75 (12/13 0755) SpO2:  [98 %-100 %] 99 % (12/13 0755)  Physical Exam:  General: cooperative and no distress  Lungs: clear to auscultation bilaterally Breasts: normal appearance, no masses or tenderness Heart: regular rate and rhythm, S1, S2 normal, no murmur, click, rub or gallop Abdomen: soft, non-tender; bowel sounds normal; no masses,  no organomegaly Pelvis: Lochia: appropriate, Uterine Fundus: firm Extremities: DVT Evaluation: No evidence of DVT seen on physical exam. Negative Homan's sign. No cords or calf tenderness. No significant calf/ankle edema.  Recent Labs    03/23/21 0833  HGB 13.4  HCT 39.7    Assessment/Plan: Breastfeeding, Lactation consult, and Contraception undecided.  Circumcision desired.  Continue Zoloft for depression postpartum. Edinburgh screening to be performed.  Patient desires to d/c home today if possible.  Will discharge home if infant stable.    LOS: 1 day   Hildred Laser, MD [facility] 03/24/2021 8:56 AM

## 2021-03-24 NOTE — Lactation Note (Signed)
This note was copied from a baby's chart. Lactation Consultation Note  Patient Name: Angela Fox FFMBW'G Date: 03/24/2021 Reason for consult: Follow-up assessment;Term Age:33 hours  Lactation follow-up. Formula given 1x overnight, 2% weight loss, void/stools above expectations.  Overnight mom felt that baby was constantly hungry. LC provided reassurance of cluster feeding, importance of putting baby to breast during this time, and reassurance that baby is doing well with the stats available.  Baby did get circumcised this morning- we discussed that this may interfere with his desire to eat for next several hours but attempts should be made.  Family plans discharge later this morning, LC to be present for next feeding and review what to expect with BF before discharge.  Maternal Data Has patient been taught Hand Expression?: Yes  Feeding Mother's Current Feeding Choice: Breast Milk  LATCH Score                    Lactation Tools Discussed/Used    Interventions Interventions: Breast feeding basics reviewed;Education  Discharge    Consult Status Consult Status: Follow-up Date: 03/24/21 Follow-up type: In-patient    [name] 03/24/2021, 8:53 AM

## 2021-03-24 NOTE — Discharge Instructions (Signed)

## 2021-03-24 NOTE — Lactation Note (Signed)
This note was copied from a baby's chart. Lactation Consultation Note  Patient Name: Angela Fox Date: 03/24/2021 Reason for consult: Follow-up assessment Age:33 hours  Lactation follow-up before discharge. Mom is feeling confident and feels that she can be independent with breastfeeding. Reviewed early hunger cues, cluster feeding, and continued feeding on demand. Guidance given for anticipated breast changes, nipple care, management of breast fullness- signs to look for with milk supply and when to seek support. Information for outpatient lactation services provided; encouraged to call when or if needed.  Maternal Data Has patient been taught Hand Expression?: Yes  Feeding Mother's Current Feeding Choice: Breast Milk  LATCH Score                    Lactation Tools Discussed/Used    Interventions    Discharge Discharge Education: Engorgement and breast care;Warning signs for feeding baby;Outpatient recommendation  Consult Status Consult Status: Complete    [name] 03/24/2021, 2:28 PM

## 2021-03-24 NOTE — Discharge Summary (Signed)
Postpartum Discharge Summary      Patient Name: Angela Fox DOB: 1985/09/20 MRN: [id number]  Date of admission: 03/23/2021 Delivery date:03/23/2021  Delivering provider: Harlin Heys  Date of discharge: 03/24/2021  Admitting diagnosis: Labor and delivery, indication for care.  Intrauterine pregnancy: [redacted]w[redacted]d    Secondary diagnosis:  Principal Problem:  Precipitous labor  Additional problems: Depression in pregnancy    Discharge diagnosis: Term Pregnancy Delivered                                              Post partum procedures: None Augmentation: AROM Complications: None  Hospital course: Onset of Labor With Vaginal Delivery      35y.o. yo [id number] admitted in Active Labor on 03/23/2021. Patient had an uncomplicated labor course as follows:  Membrane Rupture Time/Date: 9:20 AM ,03/23/2021   Delivery Method:Vaginal, Spontaneous  Episiotomy: None  Lacerations:  None  Patient had an uncomplicated postpartum course.  She is ambulating, tolerating a regular diet, passing flatus, and urinating well. Patient is discharged home in stable condition on 03/24/21.  Newborn Data: Birth date:03/23/2021  Birth time:9:49 AM  Gender:Female  Living status:Living  Apgars:8 ,9  Weight:4020 g   Magnesium Sulfate received: No BMZ received: No Rhophylac:No MMR:No T-DaP:Given prenatally Flu: Given prenatally Transfusion:No  Physical exam  Vitals:   03/23/21 1542 03/23/21 1927 03/23/21 2323 03/24/21 0755  BP: 118/88 (!) 120/92 110/77 105/75  Pulse: 75 93 83 70  Resp: 18 18 18 20   Temp: 98.8 F (37.1 C) 98.5 F (36.9 C) 98.4 F (36.9 C) 98.2 F (36.8 C)  TempSrc: Oral Oral Oral Oral  SpO2:  98% 98% 99%  Weight:      Height:       General: alert, cooperative, and no distress Lochia: appropriate Uterine Fundus: firm Incision: N/A DVT Evaluation: No evidence of DVT seen on physical exam. Negative Homan's sign. No cords or calf tenderness. No  significant calf/ankle edema.  Labs: Lab Results  Component Value Date   WBC 10.6 (H) 03/23/2021   HGB 13.4 03/23/2021   HCT 39.7 03/23/2021   MCV 82.7 03/23/2021   PLT 191 03/23/2021    Edinburgh Score: Edinburgh Postnatal Depression Scale Screening Tool 03/24/2021  I have been able to laugh and see the funny side of things. 0  I have looked forward with enjoyment to things. 0  I have blamed myself unnecessarily when things went wrong. 0  I have been anxious or worried for no good reason. 0  I have felt scared or panicky for no good reason. 0  Things have been getting on top of me. 0  I have been so unhappy that I have had difficulty sleeping. 0  I have felt sad or miserable. 0  I have been so unhappy that I have been crying. 0  The thought of harming myself has occurred to me. 0  Edinburgh Postnatal Depression Scale Total 0      After visit meds:  Allergies as of 03/24/2021   No Known Allergies      Medication List     STOP taking these medications    acyclovir 400 MG tablet Commonly known as: ZOVIRAX   Doxylamine-Pyridoxine 10-10 MG Tbec Commonly known as: Diclegis   hydrOXYzine 25 MG tablet Commonly known as: ATARAX   valACYclovir 500 MG  tablet Commonly known as: VALTREX       TAKE these medications    hydrocortisone 25 MG suppository Commonly known as: ANUSOL-HC Place 1 suppository (25 mg total) rectally 2 (two) times daily.   ibuprofen 600 MG tablet Commonly known as: ADVIL Take 1 tablet (600 mg total) by mouth every 6 (six) hours as needed.   multivitamin-prenatal 27-0.8 MG Tabs tablet Take 1 tablet by mouth daily at 12 noon.   sertraline 100 MG tablet Commonly known as: Zoloft Take 1 tablet (100 mg total) by mouth daily.         Discharge home in stable condition Infant Feeding: Breast Infant Disposition:home with mother Discharge instruction: per After Visit Summary and Postpartum booklet. Activity: Advance as tolerated. Pelvic  rest for 6 weeks.  Diet: routine diet Anticipated Birth Control: Unsure Postpartum Appointment:6 weeks Additional Postpartum F/U: Postpartum Depression checkup in 2 weeks Future Appointments:No future appointments. Follow up Visit:  Follow-up Information     Harlin Heys, MD Follow up.   Specialties: Obstetrics and Gynecology, Radiology Why: 2-3 weeks for video visit (mood check) 6 weeks postpartum visit Contact information: [address] [place] [address] [phone]                     03/24/2021 Rubie Maid, MD

## 2021-03-24 NOTE — Progress Notes (Signed)
Mother discharged.  Discharge instructions given.  Mother verbalizes understanding.  Transported by auxiliary.  

## 2021-03-25 ENCOUNTER — Encounter: Payer: Medicaid Other | Admitting: Obstetrics and Gynecology

## 2021-04-27 ENCOUNTER — Telehealth: Payer: Self-pay | Admitting: Obstetrics and Gynecology

## 2021-04-27 NOTE — Telephone Encounter (Signed)
Pt called and stated she has not received a call back from her previous telephone call which was made on today 04/27/2021. Told pt to allow 48 to 72 hours for a response from a physician and to frequently check her mychart for updates/notes.

## 2021-04-27 NOTE — Telephone Encounter (Signed)
Pt called with concerns she has mastitis, experiencing fever of 101.0 body aches, chills, pain right breast - started last night. Pt is able to get milk from breast. Please Advise.

## 2021-04-29 MED ORDER — DICLOXACILLIN SODIUM 500 MG PO CAPS: 500.0000 mg | ORAL_CAPSULE | Freq: Four times a day (QID) | ORAL | 0 refills | Status: DC

## 2021-09-28 ENCOUNTER — Other Ambulatory Visit: Payer: Self-pay | Admitting: Obstetrics and Gynecology

## 2021-11-11 ENCOUNTER — Encounter: Payer: Self-pay | Admitting: Obstetrics and Gynecology

## 2021-11-11 ENCOUNTER — Other Ambulatory Visit (HOSPITAL_COMMUNITY)
Admission: RE | Admit: 2021-11-11 | Discharge: 2021-11-11 | Disposition: A | Discharge status: Home / Self Care | Payer: Medicaid Other | Source: Ambulatory Visit | Attending: Obstetrics and Gynecology | Admitting: Obstetrics and Gynecology

## 2021-11-11 ENCOUNTER — Ambulatory Visit (INDEPENDENT_AMBULATORY_CARE_PROVIDER_SITE_OTHER): Payer: Medicaid Other | Admitting: Obstetrics and Gynecology

## 2021-11-11 VITALS — BP 104/77 | HR 69 | Resp 16 | Ht 71.0 in | Wt 180.0 lb

## 2021-11-11 DIAGNOSIS — Z113 Encounter for screening for infections with a predominantly sexual mode of transmission: Secondary | ICD-10-CM

## 2021-11-11 DIAGNOSIS — Z01419 Encounter for gynecological examination (general) (routine) without abnormal findings: Secondary | ICD-10-CM | POA: Diagnosis not present

## 2021-11-11 DIAGNOSIS — Z0000 Encounter for general adult medical examination without abnormal findings: Secondary | ICD-10-CM

## 2021-11-11 DIAGNOSIS — Z30013 Encounter for initial prescription of injectable contraceptive: Secondary | ICD-10-CM | POA: Diagnosis not present

## 2021-11-11 LAB — POCT URINE PREGNANCY: Preg Test, Ur: NEGATIVE

## 2021-11-11 MED ORDER — HYDROXYZINE HCL 25 MG PO TABS: 25.0000 mg | ORAL_TABLET | Freq: Four times a day (QID) | ORAL | 2 refills | Status: DC | PRN

## 2021-11-11 MED ORDER — MEDROXYPROGESTERONE ACETATE 150 MG/ML IM SUSP
150.0000 mg | INTRAMUSCULAR | Status: DC
  Administered 2021-11-11: 150 mg via INTRAMUSCULAR

## 2021-11-11 NOTE — Patient Instructions (Addendum)
Breast Self-Awareness Breast self-awareness is knowing how your breasts look and feel. You need to: Check your breasts on a regular basis. Tell your doctor about any changes. Become familiar with the look and feel of your breasts. This can help you catch a breast problem while it is still small and can be treated. You should do breast self-exams even if you have breast implants. What you need: A mirror. A well-lit room. A pillow or other soft object. How to do a breast self-exam Follow these steps to do a breast self-exam: Look for changes  Take off all the clothes above your waist. Stand in front of a mirror in a room with good lighting. Put your hands down at your sides. Compare your breasts in the mirror. Look for any difference between them, such as: A difference in shape. A difference in size. Wrinkles, dips, and bumps in one breast and not the other. Look at each breast for changes in the skin, such as: Redness. Scaly areas. Skin that has gotten thicker. Dimpling. Open sores (ulcers). Look for changes in your nipples, such as: Fluid coming out of a nipple. Fluid around a nipple. Bleeding. Dimpling. Redness. A nipple that looks pushed in (retracted), or that has changed position. Feel for changes Lie on your back. Feel each breast. To do this: Pick a breast to feel. Place a pillow under the shoulder closest to that breast. Put the arm closest to that breast behind your head. Feel the nipple area of that breast using the hand of your other arm. Feel the area with the pads of your three middle fingers by making small circles with your fingers. Use light, medium, and firm pressure. Continue the overlapping circles, moving downward over the breast. Keep making circles with your fingers. Stop when you feel your ribs. Start making circles with your fingers again, this time going upward until you reach your collarbone. Then, make circles outward across your breast and into your  armpit area. Squeeze your nipple. Check for discharge and lumps. Repeat these steps to check your other breast. Sit or stand in the tub or shower. With soapy water on your skin, feel each breast the same way you did when you were lying down. Write down what you find Writing down what you find can help you remember what to tell your doctor. Write down: What is normal for each breast. Any changes you find in each breast. These include: The kind of changes you find. A tender or painful breast. Any lump you find. Write down its size and where it is. When you last had your monthly period (menstrual cycle). General tips If you are breastfeeding, the best time to check your breasts is after you feed your baby or after you use a breast pump. If you get monthly bleeding, the best time to check your breasts is 5-7 days after your monthly cycle ends. With time, you will become comfortable with the self-exam. You will also start to know if there are changes in your breasts. Contact a doctor if: You see a change in the shape or size of your breasts or nipples. You see a change in the skin of your breast or nipples, such as red or scaly skin. You have fluid coming from your nipples that is not normal. You find a new lump or thick area. You have breast pain. You have any concerns about your breast health. Summary Breast self-awareness includes looking for changes in your breasts and feeling for changes   within your breasts. You should do breast self-awareness in front of a mirror in a well-lit room. If you get monthly periods (menstrual cycles), the best time to check your breasts is 5-7 days after your period ends. Tell your doctor about any changes you see in your breasts. Changes include changes in size, changes on the skin, painful or tender breasts, or fluid from your nipples that is not normal. This information is not intended to replace advice given to you by your health care provider. Make sure  you discuss any questions you have with your health care provider. Document Revised: 01/29/2021 Document Reviewed: 01/29/2021 Elsevier Patient Education  2023 Elsevier Inc. Preventive Care 21-39 Years Old, Female Preventive care refers to lifestyle choices and visits with your health care provider that can promote health and wellness. Preventive care visits are also called wellness exams. What can I expect for my preventive care visit? Counseling During your preventive care visit, your health care provider may ask about your: Medical history, including: Past medical problems. Family medical history. Pregnancy history. Current health, including: Menstrual cycle. Method of birth control. Emotional well-being. Home life and relationship well-being. Sexual activity and sexual health. Lifestyle, including: Alcohol, nicotine or tobacco, and drug use. Access to firearms. Diet, exercise, and sleep habits. Work and work environment. Sunscreen use. Safety issues such as seatbelt and bike helmet use. Physical exam Your health care provider may check your: Height and weight. These may be used to calculate your BMI (body mass index). BMI is a measurement that tells if you are at a healthy weight. Waist circumference. This measures the distance around your waistline. This measurement also tells if you are at a healthy weight and may help predict your risk of certain diseases, such as type 2 diabetes and high blood pressure. Heart rate and blood pressure. Body temperature. Skin for abnormal spots. What immunizations do I need?  Vaccines are usually given at various ages, according to a schedule. Your health care provider will recommend vaccines for you based on your age, medical history, and lifestyle or other factors, such as travel or where you work. What tests do I need? Screening Your health care provider may recommend screening tests for certain conditions. This may include: Pelvic exam  and Pap test. Lipid and cholesterol levels. Diabetes screening. This is done by checking your blood sugar (glucose) after you have not eaten for a while (fasting). Hepatitis B test. Hepatitis C test. HIV (human immunodeficiency virus) test. STI (sexually transmitted infection) testing, if you are at risk. BRCA-related cancer screening. This may be done if you have a family history of breast, ovarian, tubal, or peritoneal cancers. Talk with your health care provider about your test results, treatment options, and if necessary, the need for more tests. Follow these instructions at home: Eating and drinking  Eat a healthy diet that includes fresh fruits and vegetables, whole grains, lean protein, and low-fat dairy products. Take vitamin and mineral supplements as recommended by your health care provider. Do not drink alcohol if: Your health care provider tells you not to drink. You are pregnant, may be pregnant, or are planning to become pregnant. If you drink alcohol: Limit how much you have to 0-1 drink a day. Know how much alcohol is in your drink. In the [place] one drink equals one 12 oz bottle of beer (355 mL), one 5 oz glass of wine (148 mL), or one 1 oz glass of hard liquor (44 mL). Lifestyle Brush your teeth every morning and   night with fluoride toothpaste. Floss one time each day. Exercise for at least 30 minutes 5 or more days each week. Do not use any products that contain nicotine or tobacco. These products include cigarettes, chewing tobacco, and vaping devices, such as e-cigarettes. If you need help quitting, ask your health care provider. Do not use drugs. If you are sexually active, practice safe sex. Use a condom or other form of protection to prevent STIs. If you do not wish to become pregnant, use a form of birth control. If you plan to become pregnant, see your health care provider for a prepregnancy visit. Find healthy ways to manage stress, such as: Meditation, yoga, or  listening to music. Journaling. Talking to a trusted person. Spending time with friends and family. Minimize exposure to UV radiation to reduce your risk of skin cancer. Safety Always wear your seat belt while driving or riding in a vehicle. Do not drive: If you have been drinking alcohol. Do not ride with someone who has been drinking. If you have been using any mind-altering substances or drugs. While texting. When you are tired or distracted. Wear a helmet and other protective equipment during sports activities. If you have firearms in your house, make sure you follow all gun safety procedures. Seek help if you have been physically or sexually abused. What's next? Go to your health care provider once a year for an annual wellness visit. Ask your health care provider how often you should have your eyes and teeth checked. Stay up to date on all vaccines. This information is not intended to replace advice given to you by your health care provider. Make sure you discuss any questions you have with your health care provider. Document Revised: 09/24/2020 Document Reviewed: 09/24/2020 Elsevier Patient Education  [place]. Medroxyprogesterone Injection (Contraception) What is this medication? MEDROXYPROGESTERONE (me DROX ee proe JES te rone) prevents ovulation and pregnancy. It belongs to a group of medications called contraceptives. This medication is a progestin hormone. This medicine may be used for other purposes; ask your health care provider or pharmacist if you have questions. COMMON BRAND NAME(S): Depo-Provera, Depo-subQ Provera 104 What should I tell my care team before I take this medication? They need to know if you have any of these conditions: Asthma Blood clots Breast cancer or family history of breast cancer Depression Diabetes Eating disorder (anorexia nervosa) Heart attack High blood pressure HIV infection or AIDS If you often drink alcohol Kidney  disease Liver disease Migraine headaches Osteoporosis, weak bones Seizures Stroke Tobacco smoker Vaginal bleeding An unusual or allergic reaction to medroxyprogesterone, other hormones, medications, foods, dyes, or preservatives Pregnant or trying to get pregnant Breast-feeding How should I use this medication? Depo-Provera CI contraceptive injection is given into a muscle. Depo-subQ Provera 104 injection is given under the skin. It is given in a hospital or clinic setting. The injection is usually given during the first 5 days after the start of a menstrual period or 6 weeks after delivery of a baby. A patient package insert for the product will be given with each prescription and refill. Be sure to read this information carefully each time. The sheet may change often. Talk to your care team about the use of this medication in children. Special care may be needed. These injections have been used in female children who have started having menstrual periods. Overdosage: If you think you have taken too much of this medicine contact a poison control center or emergency room at once.  NOTE: This medicine is only for you. Do not share this medicine with others. What if I miss a dose? Keep appointments for follow-up doses. You must get an injection once every 3 months. It is important not to miss your dose. Call your care team if you are unable to keep an appointment. What may interact with this medication? Antibiotics or medications for infections, especially rifampin and griseofulvin Antivirals for HIV or hepatitis Aprepitant Armodafinil Bexarotene Bosentan Medications for seizures like carbamazepine, felbamate, oxcarbazepine, phenytoin, phenobarbital, primidone, topiramate Mitotane Modafinil St. John's wort This list may not describe all possible interactions. Give your health care provider a list of all the medicines, herbs, non-prescription drugs, or dietary supplements you use. Also tell  them if you smoke, drink alcohol, or use illegal drugs. Some items may interact with your medicine. What should I watch for while using this medication? This medication does not protect you against HIV infection (AIDS) or other sexually transmitted diseases. Use of this product may cause you to lose calcium from your bones. Loss of calcium may cause weak bones (osteoporosis). Only use this product for more than 2 years if other forms of birth control are not right for you. The longer you use this product for birth control the more likely you will be at risk for weak bones. Ask your care team how you can keep strong bones. You may have a change in bleeding pattern or irregular periods. Many females stop having periods while taking this medication. If you have received your injections on time, your chance of being pregnant is very low. If you think you may be pregnant, see your care team as soon as possible. Tell your care team if you want to get pregnant within the next year. The effect of this medication may last a long time after you get your last injection. What side effects may I notice from receiving this medication? Side effects that you should report to your care team as soon as possible: Allergic reactions--skin rash, itching, hives, swelling of the face, lips, tongue, or throat Blood clot--pain, swelling, or warmth in the leg, shortness of breath, chest pain Gallbladder problems--severe stomach pain, nausea, vomiting, fever Increase in blood pressure Liver injury--right upper belly pain, loss of appetite, nausea, light-colored stool, dark yellow or brown urine, yellowing skin or eyes, unusual weakness or fatigue New or worsening migraines or headaches Seizures Stroke--sudden numbness or weakness of the face, arm, or leg, trouble speaking, confusion, trouble walking, loss of balance or coordination, dizziness, severe headache, change in vision Unusual vaginal discharge, itching, or  odor Worsening mood, feelings of depression Side effects that usually do not require medical attention (report to your care team if they continue or are bothersome): Breast pain or tenderness Dark patches of the skin on the face or other sun-exposed areas Irregular menstrual cycles or spotting Nausea Weight gain This list may not describe all possible side effects. Call your doctor for medical advice about side effects. You may report side effects to FDA at 1-800-FDA-1088. Where should I keep my medication? This injection is only given by a care team. It will not be stored at home. NOTE: This sheet is a summary. It may not cover all possible information. If you have questions about this medicine, talk to your doctor, pharmacist, or health care provider.  2023 Elsevier/Gold Standard (2020-06-01 00:00:00)

## 2021-11-11 NOTE — Progress Notes (Signed)
GYNECOLOGY ANNUAL PHYSICAL EXAM PROGRESS NOTE  Subjective:    Angela Fox is a 36 y.o. [phone] female who presents for an annual exam. The patient is not currently sexually active. The patient participates in regular exercise: yes. Has the patient ever been transfused or tattooed?: no. The patient reports that there is not domestic violence in her life.    The patient has the following concerns today:  - Reports that she would like to resume contraception. Currently abstinent after finding out about partner's infidelity. However notes her cycles are becoming more heavy and painful again. Also desires to be screened for STI's.    Menstrual History: Menarche age: 89 No LMP recorded. Period Cycle (Days): 28 Period Duration (Days): 5 Period Pattern: Regular Menstrual Flow: Moderate, Light Menstrual Control: Maxi pad Menstrual Control Change Freq (Hours): 2 Dysmenorrhea: (!) Severe Dysmenorrhea Symptoms: Cramping, Throbbing, Nausea, Diarrhea, Headache     Gynecologic History:  Contraception: abstinence but would like to resume Depo.  History of STI's: H/o gonorrhea, HSV II.   Last Pap: 09/18/2020. Results were: normal.  Reports h/o abnormal paps, has remote h/o LEEP.     Upstream - 11/11/21 1117       Pregnancy Intention Screening   Does the patient want to become pregnant in the next year? No    Does the patient's partner want to become pregnant in the next year? No    Would the patient like to discuss contraceptive options today? Yes      Contraception Wrap Up   Current Method No Method - Other Reason    End Method Hormonal Injection     Contraception Counseling Provided Yes            The pregnancy intention screening data noted above was reviewed. Potential methods of contraception were discussed. The patient elected to proceed with (S) Hormonal Injection.   OB History  Gravida Para Term Preterm AB Living  4 3 3  0 1 3  SAB IAB Ectopic Multiple Live Births   1 0 0 0 3    # Outcome Date GA Lbr Len/2nd Weight Sex Delivery Anes PTL Lv  4 Term 03/23/21 [redacted]w[redacted]d / 00:44 8 lb 13.8 oz (4.02 kg) M Vag-Spont None  LIV     Name: Stitely,BOY Demaya     Apgar1: 8  Apgar5: 9  3 SAB 2019          2 Term 09/22/16   7 lb (3.175 kg) F Vag-Spont EPI N LIV     Name: 09/24/16  1 Term 10/05/07 [redacted]w[redacted]d  6 lb 6 oz (2.892 kg) M Vag-Spont EPI N LIV     Name: [redacted]w[redacted]d    Past Medical History:  Diagnosis Date   Abnormal Pap smear of cervix    Amenorrhea    Cervical dysplasia    Required LEEP   Endometriosis    Generalized anxiety disorder 02/27/2018   Gonorrhea 09/25/2020   Herpes genitalis    Major depressive disorder, single episode, moderate (HCC) 02/27/2018    Past Surgical History:  Procedure Laterality Date   CERVICAL BIOPSY  W/ LOOP ELECTRODE EXCISION     pt was 15-34yo   LEEP      Family History  Problem Relation Age of Onset   Cancer Mother    Migraines Mother    Seizures Mother    Stroke Mother    Rheum arthritis Maternal Grandmother    Rheum arthritis Maternal Grandfather    Migraines Brother  Social History   Socioeconomic History   Marital status: Single    Spouse name: Not on file   Number of children: Not on file   Years of education: Not on file   Highest education level: Not on file  Occupational History   Occupation: Interior and spatial designer    Employer: [employer]  Tobacco Use   Smoking status: Never   Smokeless tobacco: Never  Vaping Use   Vaping Use: Never used  Substance and Sexual Activity   Alcohol use: No   Drug use: No   Sexual activity: Yes    Partners: Male    Birth control/protection: None  Other Topics Concern   Not on file  Social History Narrative   Not on file   Social Determinants of Health   Financial Resource Strain: Not on file  Food Insecurity: Not on file  Transportation Needs: Not on file  Physical Activity: Not on file  Stress: Not on file  Social Connections: Not on file  Intimate Partner Violence:  Not on file    Current Outpatient Medications on File Prior to Visit  Medication Sig Dispense Refill   dicloxacillin (DYNAPEN) 500 MG capsule Take 1 capsule (500 mg total) by mouth 4 (four) times daily. 40 capsule 0   hydrocortisone (ANUSOL-HC) 25 MG suppository Place 1 suppository (25 mg total) rectally 2 (two) times daily. 12 suppository 0   ibuprofen (ADVIL) 600 MG tablet Take 1 tablet (600 mg total) by mouth every 6 (six) hours as needed. 60 tablet 1   Prenatal Vit-Fe Fumarate-FA (MULTIVITAMIN-PRENATAL) 27-0.8 MG TABS tablet Take 1 tablet by mouth daily at 12 noon.     sertraline (ZOLOFT) 100 MG tablet Take 1 tablet (100 mg total) by mouth daily. 90 tablet 3   No current facility-administered medications on file prior to visit.    No Known Allergies   Review of Systems Constitutional: negative for chills, fatigue, fevers and sweats Eyes: negative for irritation, redness and visual disturbance Ears, nose, mouth, throat, and face: negative for hearing loss, nasal congestion, snoring and tinnitus Respiratory: negative for asthma, cough, sputum Cardiovascular: negative for chest pain, dyspnea, exertional chest pressure/discomfort, irregular heart beat, palpitations and syncope Gastrointestinal: negative for abdominal pain, change in bowel habits, nausea and vomiting Genitourinary: positive for heavy painful menstrual period x 1.  Negative for genital lesions, sexual problems and vaginal discharge, dysuria and urinary incontinence Integument/breast: negative for breast lump, breast tenderness and nipple discharge Hematologic/lymphatic: negative for bleeding and easy bruising Musculoskeletal:negative for back pain and muscle weakness Neurological: negative for dizziness, headaches, vertigo and weakness Endocrine: negative for diabetic symptoms including polydipsia, polyuria and skin dryness Allergic/Immunologic: negative for hay fever and urticaria      Objective:  Blood pressure  104/77, pulse 69, resp. rate 16, height 5\' 11"  (1.803 m), weight 180 lb (81.6 kg), last menstrual period 11/11/2021, not currently breastfeeding.  Body mass index is 25.1 kg/m.  General Appearance:    Alert, cooperative, no distress, appears stated age  Head:    Normocephalic, without obvious abnormality, atraumatic  Eyes:    PERRL, conjunctiva/corneas clear, EOM's intact, both eyes  Ears:    Normal external ear canals, both ears  Nose:   Nares normal, septum midline, mucosa normal, no drainage or sinus tenderness  Throat:   Lips, mucosa, and tongue normal; teeth and gums normal  Neck:   Supple, symmetrical, trachea midline, no adenopathy; thyroid: no enlargement/tenderness/nodules; no carotid bruit or JVD  Back:     Symmetric, no  curvature, ROM normal, no CVA tenderness  Lungs:     Clear to auscultation bilaterally, respirations unlabored  Chest Wall:    No tenderness or deformity   Heart:    Regular rate and rhythm, S1 and S2 normal, no murmur, rub or gallop  Breast Exam:    No tenderness, masses, or nipple abnormality  Abdomen:     Soft, non-tender, bowel sounds active all four quadrants, no masses, no organomegaly.    Genitalia:    Pelvic:external genitalia normal, vagina without lesions, discharge, or tenderness, small amount of blood in the vault.  Rectovaginal septum  normal. Cervix normal in appearance, no cervical motion tenderness, no adnexal masses or tenderness.  Uterus normal size, shape, mobile, regular contours, nontender.  Rectal:    Normal external sphincter.  No hemorrhoids appreciated. Internal exam not done.   Extremities:   Extremities normal, atraumatic, no cyanosis or edema  Pulses:   2+ and symmetric all extremities  Skin:   Skin color, texture, turgor normal, no rashes or lesions  Lymph nodes:   Cervical, supraclavicular, and axillary nodes normal  Neurologic:   CNII-XII intact, normal strength, sensation and reflexes throughout   .  Labs:  Lab Results  Component  Value Date   WBC 10.6 (H) 03/23/2021   HGB 13.4 03/23/2021   HCT 39.7 03/23/2021   MCV 82.7 03/23/2021   PLT 191 03/23/2021    Lab Results  Component Value Date   CREATININE 0.67 02/02/2016   BUN 8 02/02/2016   NA 134 (L) 02/02/2016   K 3.7 02/02/2016   CL 102 02/02/2016   CO2 24 02/02/2016    Lab Results  Component Value Date   ALT 19 04/01/2015   AST 21 04/01/2015   ALKPHOS 45 04/01/2015   BILITOT 0.4 04/01/2015    Lab Results  Component Value Date   TSH 1.350 03/20/2019     Assessment:   1. Well woman exam with routine gynecological exam   2. Screen for STD (sexually transmitted disease)   3. Initiation of Depo Provera      Plan:  Blood tests: see orders. Breast self exam technique reviewed and patient encouraged to perform self-exam monthly. Contraception:  Desires to resume Depo Provera.  Injection given today . Discussed healthy lifestyle modifications. Mammogram  : Not age appropriate Pap smear  UTD . STD screening performed at patient's request.  Follow up in 1 year for annual exam   Hildred Laser, MD [facility]

## 2021-11-12 LAB — CERVICOVAGINAL ANCILLARY ONLY
Chlamydia: NEGATIVE
Comment: NEGATIVE
Comment: NEGATIVE
Comment: NORMAL
Neisseria Gonorrhea: NEGATIVE
Trichomonas: NEGATIVE

## 2021-11-19 ENCOUNTER — Encounter: Payer: Self-pay | Admitting: Obstetrics and Gynecology

## 2021-11-19 MED ORDER — METRONIDAZOLE 500 MG PO TABS: 500.0000 mg | ORAL_TABLET | Freq: Two times a day (BID) | ORAL | 0 refills | Status: DC

## 2022-01-13 ENCOUNTER — Other Ambulatory Visit: Payer: Self-pay | Admitting: Obstetrics and Gynecology

## 2022-01-13 ENCOUNTER — Encounter: Payer: Self-pay | Admitting: Obstetrics and Gynecology

## 2022-01-14 MED ORDER — ACYCLOVIR 400 MG PO TABS: 400.0000 mg | ORAL_TABLET | Freq: Two times a day (BID) | ORAL | 3 refills | Status: DC

## 2022-01-19 ENCOUNTER — Encounter: Payer: Self-pay | Admitting: Obstetrics and Gynecology

## 2022-01-20 IMAGING — US US OB COMP LESS 14 WK
1 series · 14 of 28 positions shown · non-contrast
Comparison: None.

CLINICAL DATA: Pregnant patient. Vaginal bleeding for 1 day.
Patient is 11 weeks and 5 days pregnant based on her last menstrual
period. Beta HCG level [DATE].

EXAM:
OBSTETRIC <14 WK ULTRASOUND
TECHNIQUE: Transabdominal ultrasound was performed for evaluation of the
gestation as well as the maternal uterus and adnexal regions.

[Series 1: us ob less than 14 weeks with ob transvaginal · 14 of 68 slices shown]
[im 3/68]
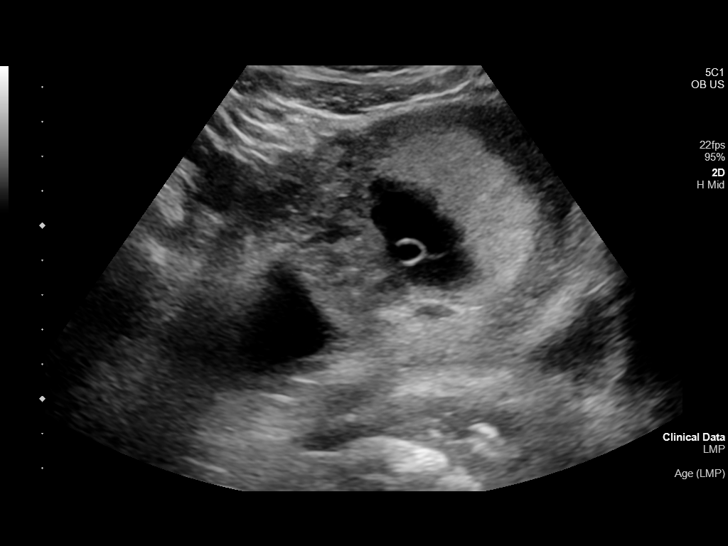
[im 8/68]
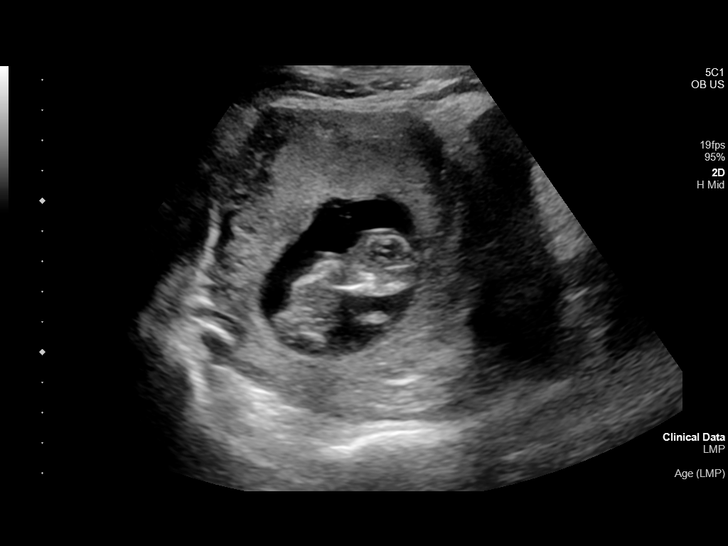
[im 13/68]
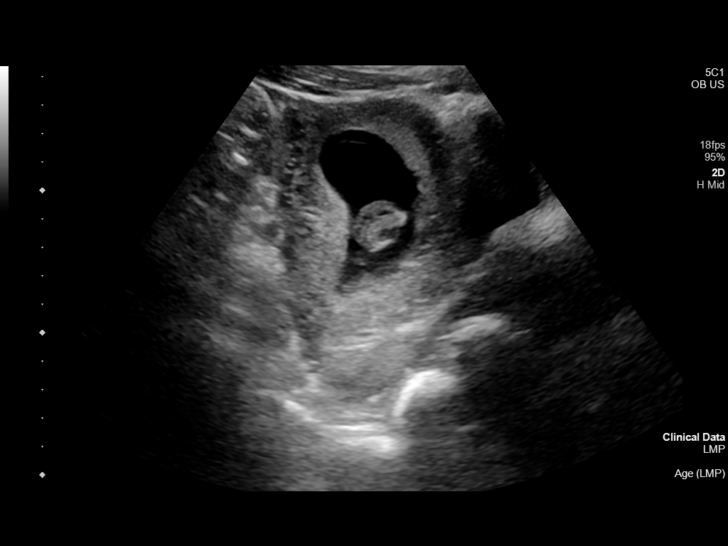
[im 18/68]
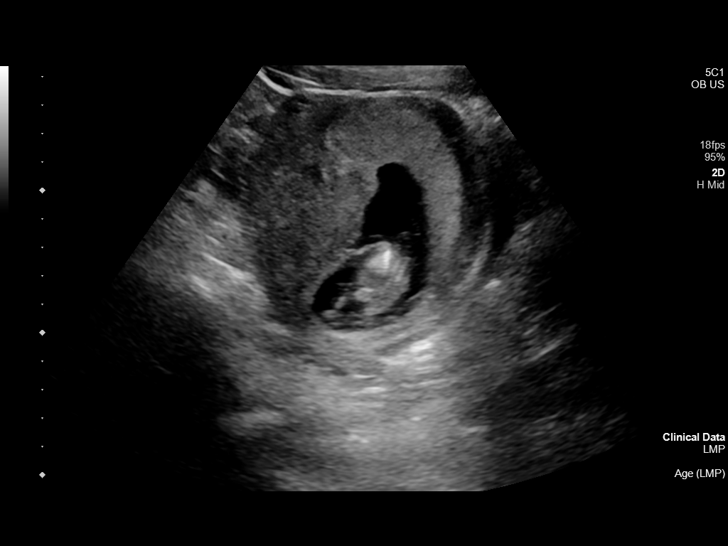
[im 23/68]
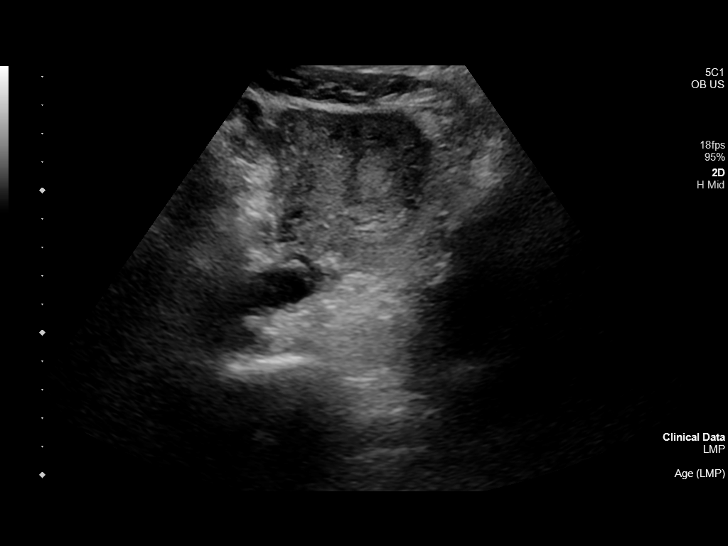
[im 28/68]
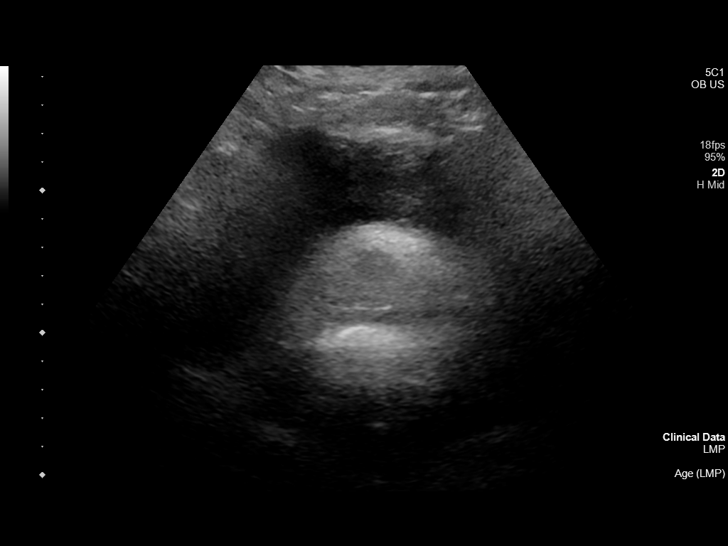
[im 33/68]
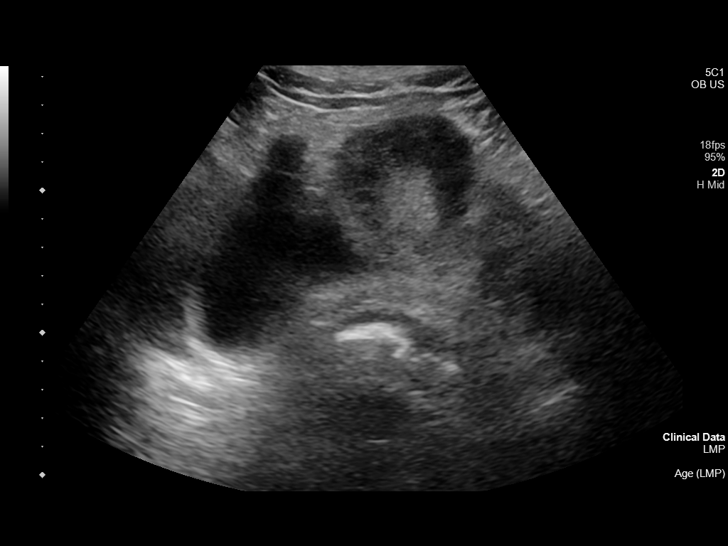
[im 38/68]
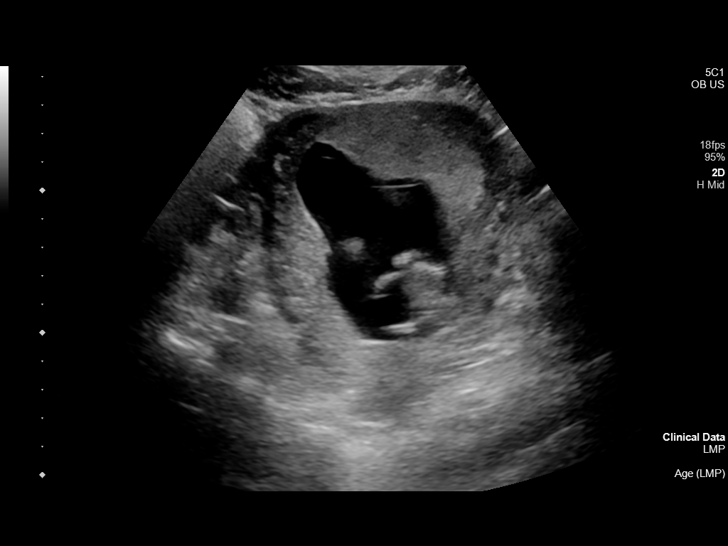
[im 43/68]
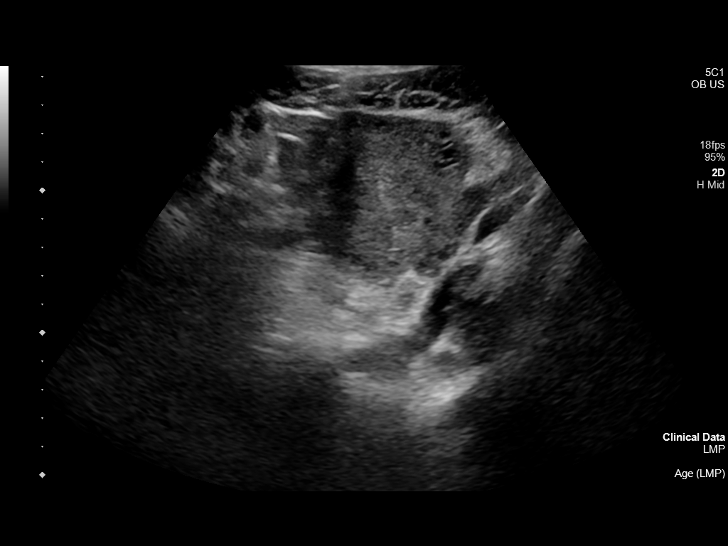
[im 48/68]
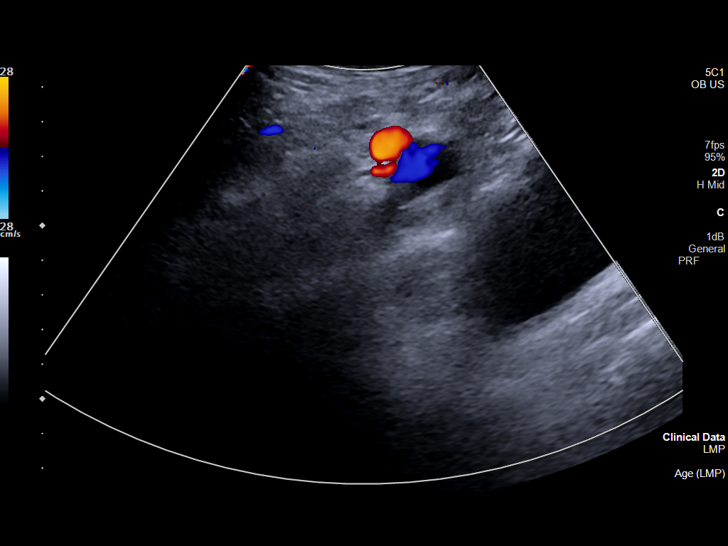
[im 53/68]
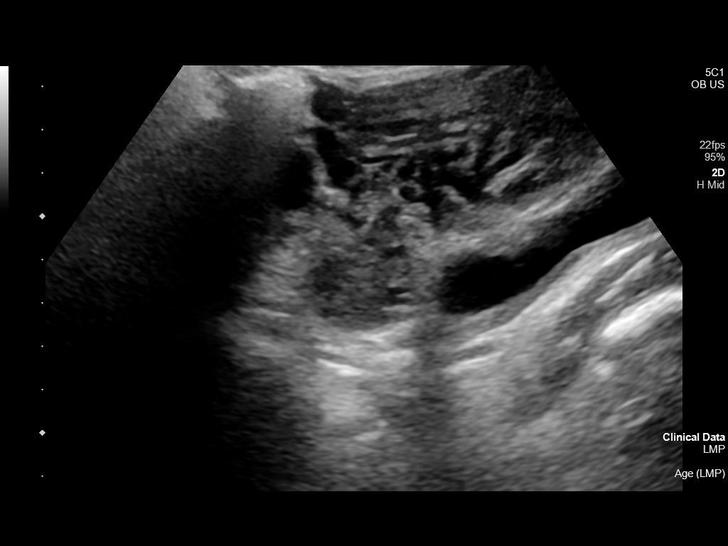
[im 58/68]
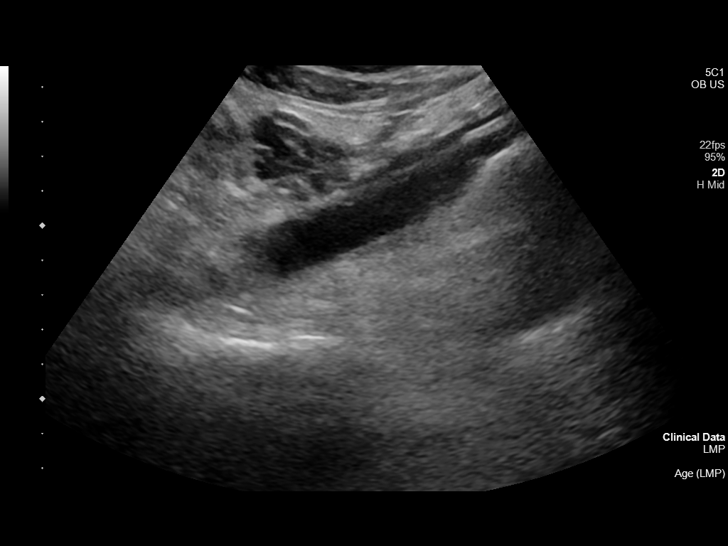
[im 63/68]
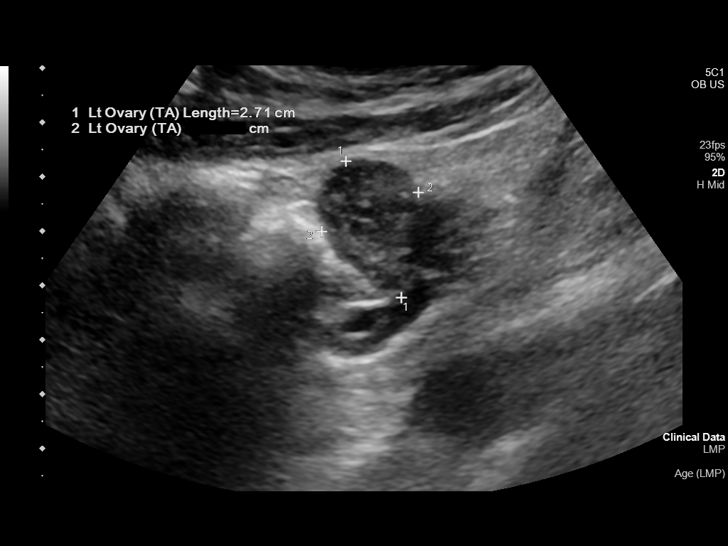
[im 68/68]
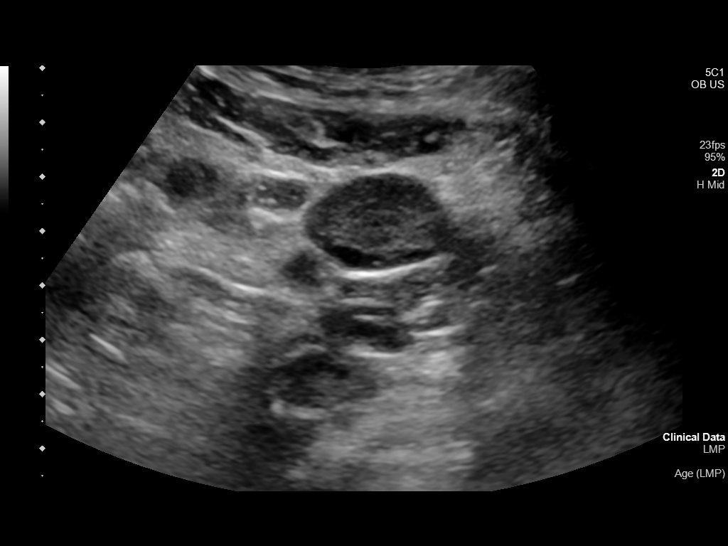

[14 of 28 positions shown; findings below may reference images not displayed]

FINDINGS: Intrauterine gestational sac: Single

Yolk sac:  Visualized.

Embryo:  Visualized.

Cardiac Activity: Visualized.

Heart Rate: 166 bpm

CRL:   53.0 mm   12 w 0 d                  US EDC: 03/21/2021.

Subchorionic hemorrhage:  None visualized.

Maternal uterus/adnexae: No uterine masses. Cervix is closed.
Ovaries and adnexa are unremarkable. No pelvic free fluid.
IMPRESSION: 1. Single live intrauterine pregnancy with a measured gestational
age of 12 weeks. No pregnancy complication. Normal ovaries and
adnexa.

## 2022-01-21 ENCOUNTER — Other Ambulatory Visit: Payer: Self-pay | Admitting: Obstetrics and Gynecology

## 2022-01-27 ENCOUNTER — Ambulatory Visit: Payer: Medicaid Other

## 2022-03-20 IMAGING — US US OB COMP +14 WK
1 series · 15 of 28 positions shown · non-contrast
Comparison: none

CLINICAL DATA: Pregnancy.  Assess fetal anatomy.

EXAM:
OBSTETRICAL ULTRASOUND >14 WKS

[Series 1: us ob comp + 14 wk · 71 acquisitions, 15 frames shown]
[im 1/71]
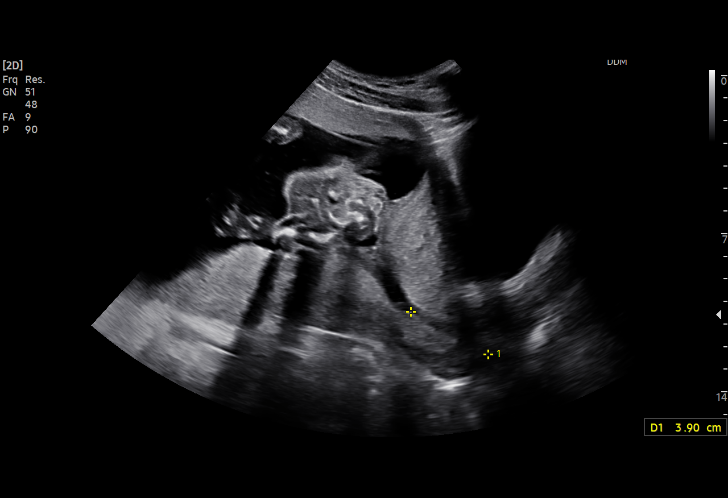
[im 6/71]
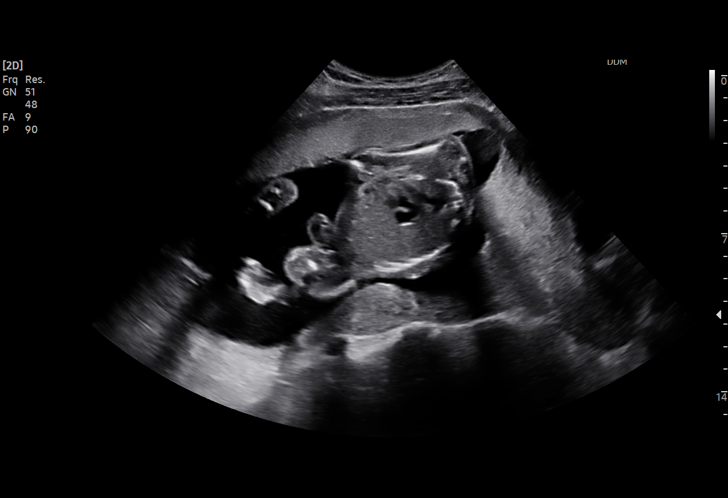
[im 11/71]
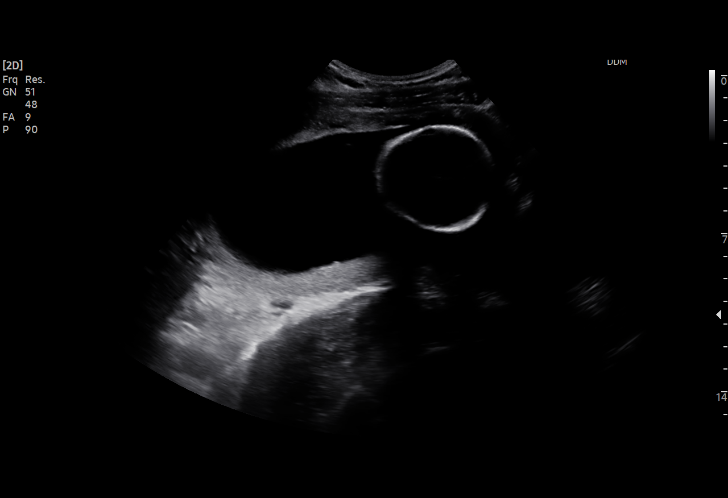
[im 16/71]
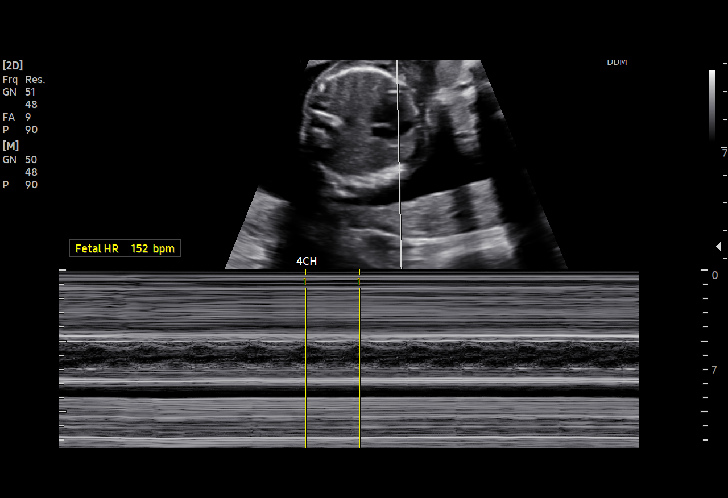
[im 21/71]
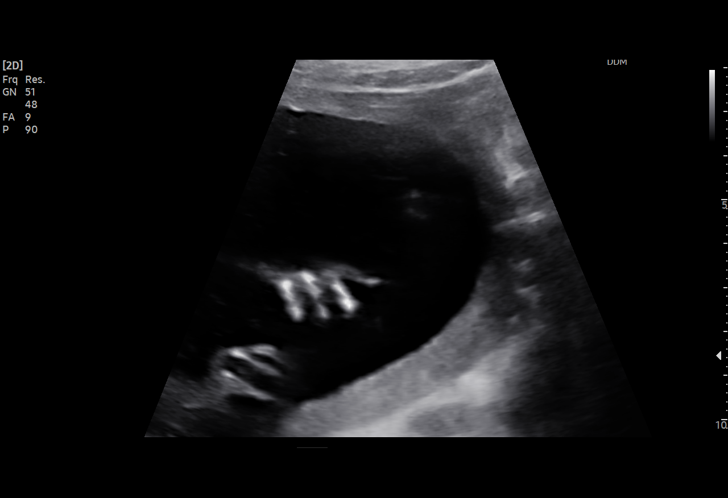
[im 26/71]
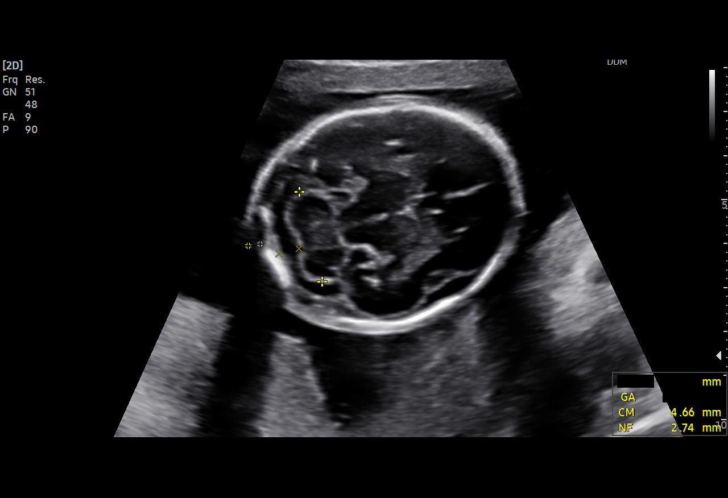
[im 32/71]
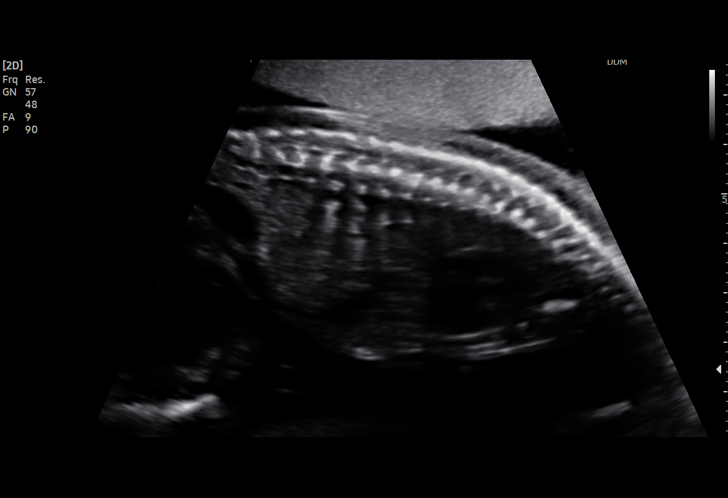
[im 37/71]
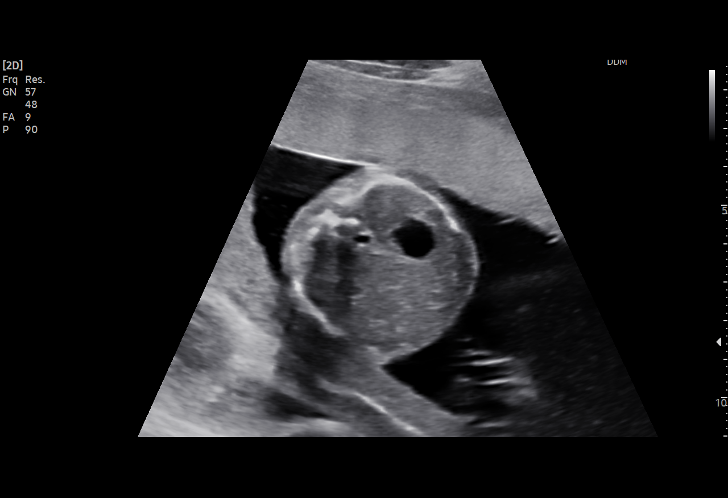
[im 39/71]
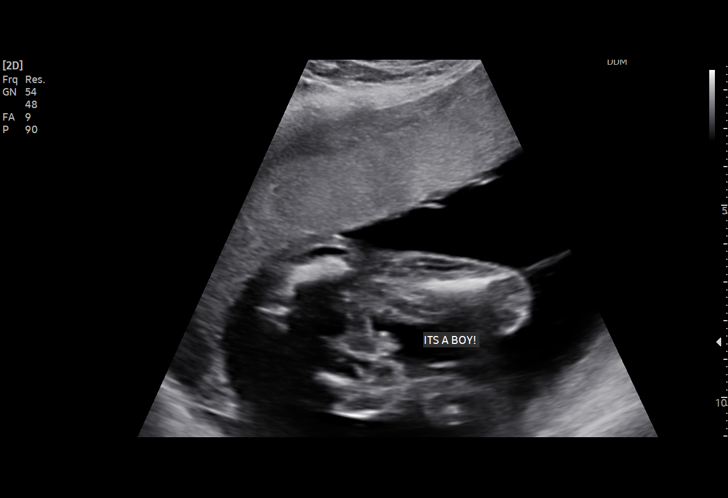
[im 45/71]
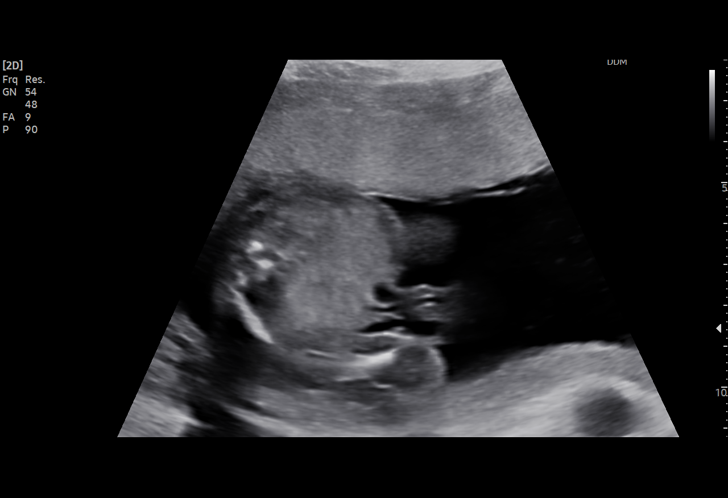
[im 50/71]
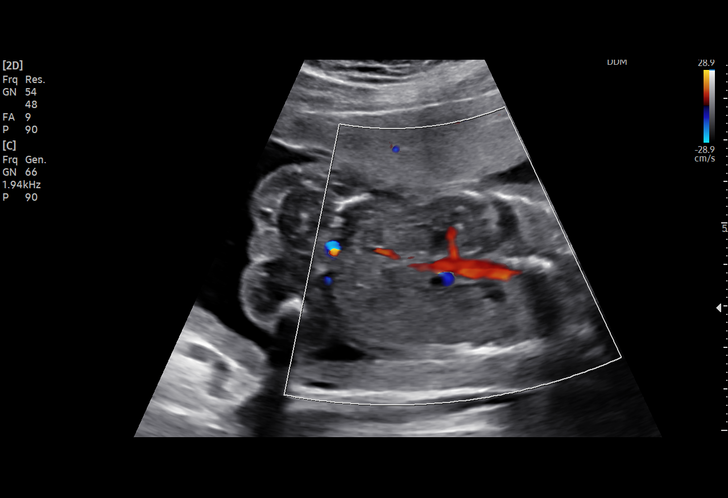
[im 55/71]
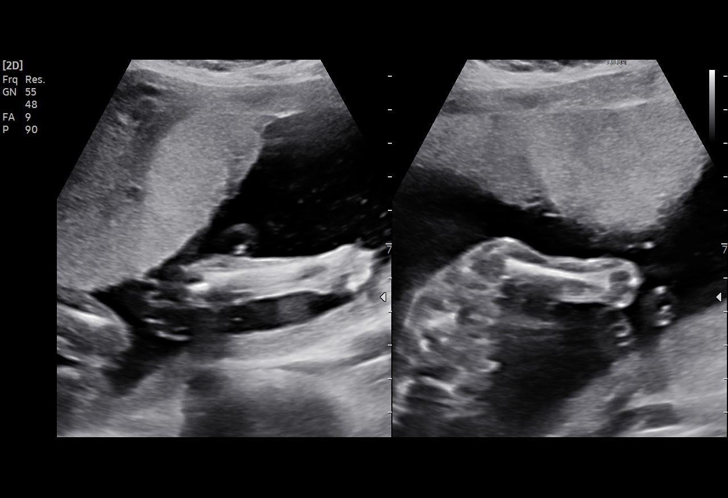
[im 60/71]
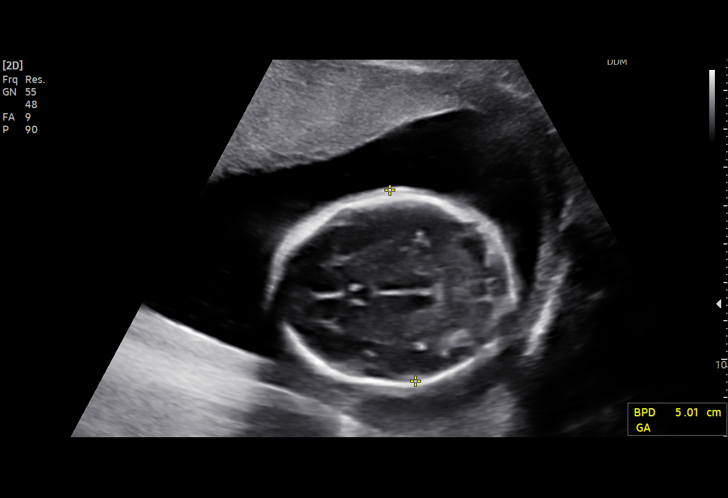
[im 65/71]
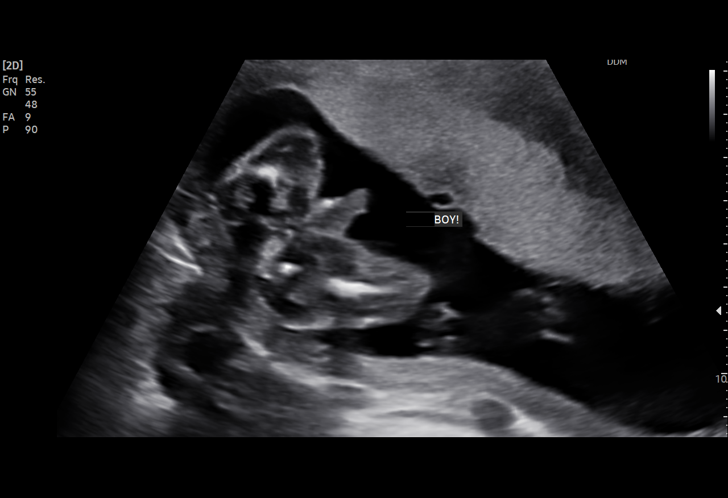
[im 71/71]
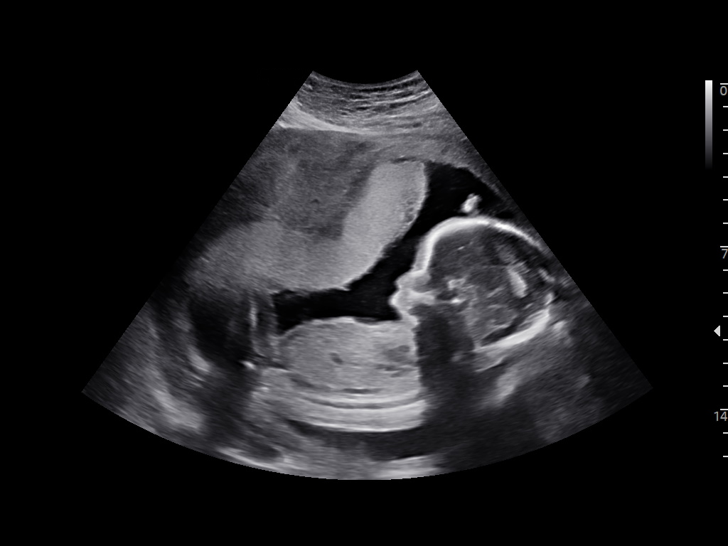

[15 of 28 positions shown; findings below may reference images not displayed]

FINDINGS: Number of Fetuses: 1

Heart Rate:  152 bpm

Movement: Yes

Presentation: Cephalic

Previa: No

Placental Location: Anterior

Amniotic Fluid (Subjective): Normal

Amniotic Fluid (Objective):

Vertical pocket = 5.2cm

FETAL BIOMETRY

BPD: 5.0cm 21w 1d

HC:   18.3cm 20w 5d

AC:   15.3cm 20w 3d

FL:   3.2cm 20w 1d

Current Mean GA: 20w 3d US EDC: 03/21/2021

Assigned GA:  20w 1d Assigned EDC: 03/23/2021

Estimated Fetal Weight:  349g

FETAL ANATOMY

Lateral Ventricles: Appears normal

Thalami/CSP: Appears normal

Posterior Fossa:  Appears normal

Nuchal Region: Appears normal   NFT= 3 mm

Upper Lip: Appears normal

Spine: Appears normal

4 Chamber Heart on Left: Appears normal

LVOT: Appears normal

RVOT: Appears normal

Stomach on Left: Appears normal

3 Vessel Cord: Appears normal

Cord Insertion site: Appears normal

Kidneys: Appears normal

Bladder: Appears normal

Extremities: Appears normal

Sex: Male

Technically difficult due to: None.

Maternal Findings:

Cervix:  Closed.  3.9 cm.
IMPRESSION: 1. Single live intrauterine gestation in cephalic presentation.
2. Assigned gestational age of 20 weeks 1 day. Adequate interval
growth.
3. Complete fetal anatomic survey.  No fetal anomaly detected.

## 2022-04-04 ENCOUNTER — Other Ambulatory Visit: Payer: Self-pay

## 2022-05-17 ENCOUNTER — Telehealth: Payer: Self-pay | Admitting: Urgent Care

## 2022-05-17 DIAGNOSIS — N3000 Acute cystitis without hematuria: Secondary | ICD-10-CM

## 2022-05-17 MED ORDER — NITROFURANTOIN MONOHYD MACRO 100 MG PO CAPS: 100.0000 mg | ORAL_CAPSULE | Freq: Two times a day (BID) | ORAL | 0 refills | Status: AC

## 2022-05-17 NOTE — Progress Notes (Signed)
E-Visit for Urinary Problems  We are sorry that you are not feeling well.  Here is how we plan to help!  Based on what you shared with me it looks like you most likely have a simple urinary tract infection.  A UTI (Urinary Tract Infection) is a bacterial infection of the bladder.  Most cases of urinary tract infections are simple to treat but a key part of your care is to encourage you to drink plenty of fluids and watch your symptoms carefully.  I have prescribed MacroBid 100 mg twice a day for 5 days.  Your symptoms should gradually improve. Call us if the burning in your urine worsens, you develop worsening fever, back pain or pelvic pain or if your symptoms do not resolve after completing the antibiotic.  Urinary tract infections can be prevented by drinking plenty of water to keep your body hydrated.  Also be sure when you wipe, wipe from front to back and don't hold it in!  If possible, empty your bladder every 4 hours.  HOME CARE Drink plenty of fluids Compete the full course of the antibiotics even if the symptoms resolve Remember, when you need to go.go. Holding in your urine can increase the likelihood of getting a UTI! GET HELP RIGHT AWAY IF: You cannot urinate You get a high fever Worsening back pain occurs You see blood in your urine You feel sick to your stomach or throw up You feel like you are going to pass out  MAKE SURE YOU  Understand these instructions. Will watch your condition. Will get help right away if you are not doing well or get worse.   Thank you for choosing an e-visit.  Your e-visit answers were reviewed by a board certified advanced clinical practitioner to complete your personal care plan. Depending upon the condition, your plan could have included both over the counter or prescription medications.  Please review your pharmacy choice. Make sure the pharmacy is open so you can pick up prescription now. If there is a problem, you may contact your  provider through MyChart messaging and have the prescription routed to another pharmacy.  Your safety is important to us. If you have drug allergies check your prescription carefully.   For the next 24 hours you can use MyChart to ask questions about today's visit, request a non-urgent call back, or ask for a work or school excuse. You will get an email in the next two days asking about your experience. I hope that your e-visit has been valuable and will speed your recovery.   I have spent 5 minutes in review of e-visit questionnaire, review and updating patient chart, medical decision making and response to patient.   Sharika Mosquera L Schelly Chuba, PA    

## 2022-08-31 ENCOUNTER — Other Ambulatory Visit: Payer: Self-pay | Admitting: Obstetrics and Gynecology

## 2022-08-31 NOTE — Telephone Encounter (Signed)
Refills sent, but patient is due for annual exam in August, needs to be scheduled.

## 2022-09-08 ENCOUNTER — Ambulatory Visit (INDEPENDENT_AMBULATORY_CARE_PROVIDER_SITE_OTHER): Payer: Medicaid Other

## 2022-09-08 ENCOUNTER — Other Ambulatory Visit (HOSPITAL_COMMUNITY)
Admission: RE | Admit: 2022-09-08 | Discharge: 2022-09-08 | Disposition: A | Discharge status: Home / Self Care | Payer: Medicaid Other | Source: Ambulatory Visit | Attending: Obstetrics and Gynecology | Admitting: Obstetrics and Gynecology

## 2022-09-08 VITALS — BP 111/70 | HR 79 | Ht 71.0 in | Wt 184.0 lb

## 2022-09-08 DIAGNOSIS — N898 Other specified noninflammatory disorders of vagina: Secondary | ICD-10-CM

## 2022-09-08 DIAGNOSIS — Z3042 Encounter for surveillance of injectable contraceptive: Secondary | ICD-10-CM

## 2022-09-08 DIAGNOSIS — Z113 Encounter for screening for infections with a predominantly sexual mode of transmission: Secondary | ICD-10-CM | POA: Insufficient documentation

## 2022-09-08 MED ORDER — MEDROXYPROGESTERONE ACETATE 150 MG/ML IM SUSP
150.0000 mg | Freq: Once | INTRAMUSCULAR | Status: AC
  Administered 2022-09-08: 150 mg via INTRAMUSCULAR

## 2022-09-08 NOTE — Progress Notes (Signed)
    NURSE VISIT NOTE  Subjective:    Patient ID: Angela Fox, female    DOB: 1985-10-18, 37 y.o.   MRN: [id number]  HPI  Patient is a 37 y.o. [phone] female who presents for clear, white, and milky vaginal discharge for 2 week(s). Pt states she gets BV very easy and often. Pt states she received a Email stating she should get testes for STI.  Denies abnormal vaginal bleeding or significant pelvic pain or fever. denies dysuria, hematuria, urinary frequency, urinary urgency, flank pain, abdominal pain, pelvic pain, cloudy malordorous urine, genital rash, genital irritation, and vaginal discharge. Patient denies history of known exposure to STD.   Objective:    BP 111/70   Pulse 79   Ht 5\' 11"  (1.803 m)   Wt 184 lb (83.5 kg)   LMP 09/08/2022   BMI 25.66 kg/m    @THIS  VISIT ONLY@  Assessment:   1. Encounter for Depo-Provera contraception   2. Vaginal discharge   3. Vaginal odor   4. Screening examination for STI       Plan:   GC and chlamydia Yeast and BV DNA  probe sent to lab. Treatment: abstain from coitus during course of treatment ROV prn if symptoms persist or worsen.   Angela Fox, CMA

## 2022-09-08 NOTE — Patient Instructions (Signed)
Contraceptive Injection A contraceptive injection is a shot that prevents pregnancy. It is also called a birth control shot. The shot contains the hormone progestin, which prevents pregnancy by: Stopping the ovaries from releasing eggs. Thickening cervical mucus to prevent sperm from entering the cervix. Thinning the lining of the uterus to prevent a fertilized egg from attaching to the uterus. Contraceptive injections are given under the skin (subcutaneous) or into a muscle (intramuscular). For these shots to work, you must get one of them every 3 months (12-13 weeks) from a health care provider. Tell a health care provider about: Any allergies you have. All medicines you are taking, including vitamins, herbs, eye drops, creams, and over-the-counter medicines. Any blood disorders you have. Any medical conditions you have. Whether you are pregnant or may be pregnant. What are the risks? Generally, this is a safe procedure. However, problems may occur, including: Mood changes or depression. Loss of bone density (osteoporosis) after long-term use. Blood clots. This is rare. Higher risk of an egg being fertilized outside your uterus (ectopic pregnancy).This is rare. What happens before the procedure? Your health care provider may do a routine physical exam. You may have a test to make sure you are not pregnant. What happens during the procedure?  The area where the shot will be given will be cleaned and sanitized with alcohol. A needle will be inserted into a muscle in your upper arm or buttock, or into the skin of your thigh or abdomen. The needle will be attached to a syringe with the medicine inside of it. The medicine will be pushed through the syringe and injected into your body. A small bandage (dressing) may be placed over the injection site. What can I expect after the procedure? After the procedure, it is common to have: Soreness around the injection site for a couple of  days. Irregular menstrual bleeding. Weight gain. Breast tenderness. Headaches. Discomfort in your abdomen. Ask your health care provider whether you need to use an added method of birth control (backup contraception), such as a condom, sponge, or spermicide. If the first shot is given 1-7 days after the start of your last menstrual period, you will not need backup contraception. If the first shot is given at any other time during your menstrual cycle, you should avoid having sex. If you do have sex, you will need to use backup contraception for 7 days after you receive the shot. Follow these instructions at home: General instructions Take over-the-counter and prescription medicines only as told by your health care provider. Do not rub or massage the injection site. Track your menstrual periods so you will know if they become irregular. Always use a condom to protect against sexually transmitted infections (STIs). Make sure you schedule an appointment in time for your next shot and mark it on your calendar. You must get an injection every 3 months (12-13 weeks) to prevent pregnancy. Lifestyle Do not use any products that contain nicotine or tobacco. These products include cigarettes, chewing tobacco, and vaping devices, such as e-cigarettes. If you need help quitting, ask your health care provider. Eat foods that are high in calcium and vitamin D, such as milk, cheese, and salmon. Doing this may help with any loss in bone density caused by the contraceptive injection. Ask your health care provider for dietary recommendations. Contact a health care provider if you: Have nausea or vomiting. Have abnormal vaginal discharge or bleeding. Miss a menstrual period or think you might be pregnant. Experience mood changes  or depression. Feel dizzy or light-headed. Have leg pain. Get help right away if you: Have chest pain or cough up blood. Have shortness of breath. Have a severe headache that does  not go away. Have numbness in any part of your body. Have slurred speech or vision problems. Have vaginal bleeding that is abnormally heavy or does not stop, or you have severe pain in your abdomen. Have depression that does not get better with treatment. If you ever feel like you may hurt yourself or others, or have thoughts about taking your own life, get help right away. Go to your nearest emergency department or: Call your local emergency services (911 in the [place] Call a suicide crisis helpline, such as the National Suicide Prevention Lifeline at 928-167-6019 or 988 in the [place] This is open 24 hours a day in the [place] Text the Crisis Text Line at 857-677-3683 (in the [place] Summary A contraceptive injection is a shot that prevents pregnancy. It is also called the birth control shot. The shot is given under the skin (subcutaneous) or into a muscle (intramuscular). After this procedure, it is common to have soreness around the injection site for a couple of days. To prevent pregnancy, the shot must be given by a health care provider every 3 months (12-13 weeks). After you have the shot, ask your health care provider whether you need to use an added method of birth control (backup contraception), such as a condom, sponge, or spermicide. This information is not intended to replace advice given to you by your health care provider. Make sure you discuss any questions you have with your health care provider. Document Revised: 10/22/2020 Document Reviewed: 10/08/2019 Elsevier Patient Education  2024 [employer].

## 2022-09-09 ENCOUNTER — Other Ambulatory Visit: Payer: Self-pay | Admitting: Obstetrics and Gynecology

## 2022-09-09 DIAGNOSIS — N760 Acute vaginitis: Secondary | ICD-10-CM

## 2022-09-09 LAB — CERVICOVAGINAL ANCILLARY ONLY
Bacterial Vaginitis (gardnerella): POSITIVE — AB
Candida Glabrata: NEGATIVE
Candida Vaginitis: NEGATIVE
Chlamydia: NEGATIVE
Comment: NEGATIVE
Comment: NEGATIVE
Comment: NEGATIVE
Comment: NEGATIVE
Comment: NEGATIVE
Comment: NORMAL
Neisseria Gonorrhea: NEGATIVE
Trichomonas: NEGATIVE

## 2022-09-09 MED ORDER — METRONIDAZOLE 500 MG PO TABS: 500.0000 mg | ORAL_TABLET | Freq: Two times a day (BID) | ORAL | 1 refills | Status: DC

## 2022-09-22 NOTE — Telephone Encounter (Signed)
The patient is scheduled for 8/20 with Dr. Valentino Saxon

## 2022-09-23 ENCOUNTER — Ambulatory Visit: Payer: Medicaid Other | Admitting: Obstetrics and Gynecology

## 2022-09-23 DIAGNOSIS — Z3009 Encounter for other general counseling and advice on contraception: Secondary | ICD-10-CM

## 2022-09-28 ENCOUNTER — Encounter: Payer: Self-pay | Admitting: Obstetrics and Gynecology

## 2022-09-29 MED ORDER — SERTRALINE HCL 100 MG PO TABS: 150.0000 mg | ORAL_TABLET | Freq: Every day | ORAL | 3 refills | Status: DC

## 2022-09-30 MED ORDER — CLONAZEPAM 0.5 MG PO TABS: 0.5000 mg | ORAL_TABLET | Freq: Every evening | ORAL | 1 refills | Status: DC | PRN

## 2022-09-30 NOTE — Addendum Note (Signed)
Addended by: Fabian November on: 09/30/2022 11:00 PM   Modules accepted: Orders

## 2022-10-11 ENCOUNTER — Telehealth: Payer: Medicaid Other | Admitting: Physician Assistant

## 2022-10-11 DIAGNOSIS — J020 Streptococcal pharyngitis: Secondary | ICD-10-CM

## 2022-10-12 MED ORDER — AMOXICILLIN 500 MG PO TABS: 500.0000 mg | ORAL_TABLET | Freq: Two times a day (BID) | ORAL | 0 refills | Status: AC

## 2022-10-12 NOTE — Progress Notes (Signed)
I have spent 5 minutes in review of e-visit questionnaire, review and updating patient chart, medical decision making and response to patient.   Shandale Malak Cody Burch [place], PA-C    

## 2022-10-12 NOTE — Progress Notes (Signed)

## 2022-11-26 ENCOUNTER — Telehealth: Payer: Self-pay | Admitting: Obstetrics and Gynecology

## 2022-11-26 NOTE — Telephone Encounter (Signed)
I contacted the patient via phone x2 for rescheduling for Tuesday, 8/20 with Dr. Valentino Saxon. Dr. Oretha Milch schedule hasn't been released for September. There was no answer and voicemail is not set up, I was unable to leave voicemail. I have added the patient to waitlist to contact patient for scheduling when openings are ready to offer. If the patient prefers to be seen sooner please advise the patient next available appointment with another provider. Ok's per [facility]

## 2022-11-30 ENCOUNTER — Ambulatory Visit: Payer: Medicaid Other | Admitting: Obstetrics and Gynecology

## 2022-11-30 ENCOUNTER — Telehealth: Payer: Self-pay

## 2022-11-30 ENCOUNTER — Ambulatory Visit: Payer: Medicaid Other

## 2022-11-30 VITALS — BP 122/74 | HR 88 | Resp 16 | Ht 71.0 in | Wt 189.0 lb

## 2022-11-30 DIAGNOSIS — Z3042 Encounter for surveillance of injectable contraceptive: Secondary | ICD-10-CM

## 2022-11-30 MED ORDER — MEDROXYPROGESTERONE ACETATE 150 MG/ML IM SUSP
150.0000 mg | Freq: Once | INTRAMUSCULAR | Status: AC
  Administered 2022-11-30: 150 mg via INTRAMUSCULAR

## 2022-11-30 NOTE — Telephone Encounter (Signed)
Patient came in today for annual exam and depo. She did not get the message that you were not in office today. She reports that she has been having some bleeding with her depo. She also needs to talk to you about some concerns that she is having with Zoloft.  I told her to schedule an appointment with you for medication management if she couldn't get her annual exam rescheduled early. I also asked her to send a mychart message about the symptoms that she is having with the Zoloft. She said she was having some insomnia and night sweats.

## 2022-11-30 NOTE — Patient Instructions (Signed)

## 2022-11-30 NOTE — Progress Notes (Addendum)
    NURSE VISIT NOTE  Subjective:    Patient ID: Angela Fox, female    DOB: 20-Feb-1986, 37 y.o.   MRN: [id number]  HPI  Patient is a 37 y.o. [phone] female who presents for depo provera injection.   Objective:    BP 122/74   Pulse 88   Resp 16   Ht 5\' 11"  (1.803 m)   Wt 189 lb (85.7 kg)   BMI 26.36 kg/m   Last Annual: 11/11/2021. Last pap: 09/18/2020. Last Depo-Provera: 09/08/2022. Side Effects if any: none. Serum HCG indicated? Yes . Depo-Provera 150 mg IM given by: Santiago Bumpers, CMA. Site: Left Upper Outer [place]  Lab Review  None  Assessment:   1. Encounter for Depo-Provera contraception      Plan:   Next appointment due between November 5 and November 19.    Santiago Bumpers, CMA [facility]

## 2022-12-01 NOTE — Telephone Encounter (Signed)
Ok thank you. Yes, I can address those issues separately from her annual since she has several concerns.

## 2022-12-14 ENCOUNTER — Encounter: Payer: Self-pay | Admitting: Obstetrics and Gynecology

## 2022-12-14 ENCOUNTER — Ambulatory Visit (INDEPENDENT_AMBULATORY_CARE_PROVIDER_SITE_OTHER): Payer: Medicaid Other | Admitting: Obstetrics and Gynecology

## 2022-12-14 VITALS — BP 109/72 | HR 80 | Resp 16 | Ht 71.0 in | Wt 191.4 lb

## 2022-12-14 DIAGNOSIS — Z131 Encounter for screening for diabetes mellitus: Secondary | ICD-10-CM

## 2022-12-14 DIAGNOSIS — Z01419 Encounter for gynecological examination (general) (routine) without abnormal findings: Secondary | ICD-10-CM | POA: Diagnosis not present

## 2022-12-14 DIAGNOSIS — Z13220 Encounter for screening for lipoid disorders: Secondary | ICD-10-CM

## 2022-12-14 DIAGNOSIS — G479 Sleep disorder, unspecified: Secondary | ICD-10-CM

## 2022-12-14 DIAGNOSIS — F411 Generalized anxiety disorder: Secondary | ICD-10-CM

## 2022-12-14 DIAGNOSIS — F321 Major depressive disorder, single episode, moderate: Secondary | ICD-10-CM

## 2022-12-14 DIAGNOSIS — Z3042 Encounter for surveillance of injectable contraceptive: Secondary | ICD-10-CM

## 2022-12-14 MED ORDER — ESZOPICLONE 1 MG PO TABS: 1.0000 mg | ORAL_TABLET | Freq: Every evening | ORAL | 3 refills | Status: DC | PRN

## 2022-12-14 MED ORDER — HYDROXYZINE HCL 50 MG PO TABS: 50.0000 mg | ORAL_TABLET | Freq: Three times a day (TID) | ORAL | 0 refills | Status: DC | PRN

## 2022-12-14 NOTE — Progress Notes (Signed)
GYNECOLOGY ANNUAL PHYSICAL EXAM PROGRESS NOTE  Subjective:    Angela Fox is a 37 y.o. [phone] female who presents for an annual exam. The patient has no complaints today. The patient is sexually active. The patient participates in regular exercise: yes. Has the patient ever been transfused or tattooed?: yes. The patient reports that there is not domestic violence in her life.   Angela Fox complains that she is still having difficulty sleeping. Is currently on Atarax and recently added Klonopin due to history of anxiety and racing thoughts. Notes that she takes both medications as prescribed but still has difficulty sleeping. States that when she does get sleep, it's ony ~ 3 hours per night.  Has difficulty falling asleep and staying asleep. Also notes dreaming more and increase in sweating which makes it uncomfortable for her to fall back asleep.   She also has questions about how long she may have to utilize her Zoloft. States that she read that after 3 years of use, it is likely that she will remain on the medication for her lifetime. Has tried to hold meds for a few days but notes that she does not feel well without them.   Menstrual History: Menarche age: 34 No LMP recorded.(Depo Provera)    Gynecologic History:  Contraception: Depo-Provera injections History of STI's: H/o gonorrhea, HSV II.   Last Pap: 09/18/2020. Results were: normal.  Reports h/o abnormal paps, has remote h/o LEEP.     Upstream - 12/14/22 1322       Pregnancy Intention Screening   Does the patient want to become pregnant in the next year? No    Does the patient's partner want to become pregnant in the next year? No    Would the patient like to discuss contraceptive options today? No      Contraception Wrap Up   Current Method Hormonal Injection    End Method Hormonal Injection    Contraception Counseling Provided No    How was the end contraceptive method provided? N/A            The pregnancy  intention screening data noted above was reviewed. Potential methods of contraception were discussed. The patient elected to proceed with Hormonal Injection.    OB History  Gravida Para Term Preterm AB Living  4 3 3  0 1 3  SAB IAB Ectopic Multiple Live Births  1 0 0 0 3    # Outcome Date GA Lbr Len/2nd Weight Sex Type Anes PTL Lv  4 Term 03/23/21 [redacted]w[redacted]d / 00:44 8 lb 13.8 oz (4.02 kg) M Vag-Spont None  LIV     Name: Fox,BOY Angela     Apgar1: 8  Apgar5: 9  3 SAB 2019          2 Term 09/22/16   7 lb (3.175 kg) F Vag-Spont EPI N LIV     Name: Angela Fox  1 Term 10/05/07 [redacted]w[redacted]d  6 lb 6 oz (2.892 kg) M Vag-Spont EPI N LIV     Name: Angela Fox    Past Medical History:  Diagnosis Date   Abnormal Pap smear of cervix    Amenorrhea    Cervical dysplasia    Required LEEP   Endometriosis    Generalized anxiety disorder 02/27/2018   Gonorrhea 09/25/2020   Herpes genitalis    Major depressive disorder, single episode, moderate (HCC) 02/27/2018    Past Surgical History:  Procedure Laterality Date   CERVICAL BIOPSY  W/ LOOP ELECTRODE EXCISION  pt was 15-16yo   LEEP      Family History  Problem Relation Age of Onset   Cancer Mother    Migraines Mother    Seizures Mother    Stroke Mother    Rheum arthritis Maternal Grandmother    Rheum arthritis Maternal Grandfather    Migraines Brother     Social History   Socioeconomic History   Marital status: Single    Spouse name: Not on file   Number of children: Not on file   Years of education: Not on file   Highest education level: Not on file  Occupational History   Occupation: Interior and spatial designer    Employer: [employer]  Tobacco Use   Smoking status: Never   Smokeless tobacco: Never  Vaping Use   Vaping status: Never Used  Substance and Sexual Activity   Alcohol use: No   Drug use: No   Sexual activity: Yes    Partners: Male    Birth control/protection: None  Other Topics Concern   Not on file  Social History Narrative   Not on  file   Social Determinants of Health   Financial Resource Strain: Not on file  Food Insecurity: Not on file  Transportation Needs: Not on file  Physical Activity: Not on file  Stress: Not on file  Social Connections: Not on file  Intimate Partner Violence: Not on file    Current Outpatient Medications on File Prior to Visit  Medication Sig Dispense Refill   acyclovir (ZOVIRAX) 400 MG tablet TAKE 1 TABLET BY MOUTH TWICE A DAY (Patient not taking: Reported on 09/08/2022) 60 tablet 3   clonazePAM (KLONOPIN) 0.5 MG tablet Take 1 tablet (0.5 mg total) by mouth at bedtime as needed for anxiety (sleep). 30 tablet 1   hydrOXYzine (ATARAX) 25 MG tablet TAKE 1 TABLET BY MOUTH EVERY 6 HOURS AS NEEDED FOR ANXIETY. 30 tablet 2   sertraline (ZOLOFT) 100 MG tablet Take 1.5 tablets (150 mg total) by mouth daily. 135 tablet 3   No current facility-administered medications on file prior to visit.    No Known Allergies   Review of Systems Constitutional: negative for chills, fatigue, fevers and sweats. Positive for sleep disturbances.  Eyes: negative for irritation, redness and visual disturbance Ears, nose, mouth, throat, and face: negative for hearing loss, nasal congestion, snoring and tinnitus Respiratory: negative for asthma, cough, sputum Cardiovascular: negative for chest pain, dyspnea, exertional chest pressure/discomfort, irregular heart beat, palpitations and syncope Gastrointestinal: negative for abdominal pain, change in bowel habits, nausea and vomiting Genitourinary: negative for abnormal menstrual periods, genital lesions, sexual problems and vaginal discharge, dysuria and urinary incontinence Integument/breast: negative for breast lump, breast tenderness and nipple discharge Hematologic/lymphatic: negative for bleeding and easy bruising Musculoskeletal:negative for back pain and muscle weakness Neurological: negative for dizziness, headaches, vertigo and weakness Endocrine: negative  for diabetic symptoms including polydipsia, polyuria and skin dryness Allergic/Immunologic: negative for hay fever and urticaria      Objective:  Blood pressure 109/72, pulse 80, resp. rate 16, height 5\' 11"  (1.803 m), weight 191 lb 6.4 oz (86.8 kg), not currently breastfeeding.  Body mass index is 26.69 kg/m.  General Appearance:    Alert, cooperative, no distress, appears stated age, overweight  Head:    Normocephalic, without obvious abnormality, atraumatic  Eyes:    PERRL, conjunctiva/corneas clear, EOM's intact, both eyes  Ears:    Normal external ear canals, both ears  Nose:   Nares normal, septum midline, mucosa normal, no drainage  or sinus tenderness  Throat:   Lips, mucosa, and tongue normal; teeth and gums normal  Neck:   Supple, symmetrical, trachea midline, no adenopathy; thyroid: no enlargement/tenderness/nodules; no carotid bruit or JVD  Back:     Symmetric, no curvature, ROM normal, no CVA tenderness  Lungs:     Clear to auscultation bilaterally, respirations unlabored  Chest Wall:    No tenderness or deformity   Heart:    Regular rate and rhythm, S1 and S2 normal, no murmur, rub or gallop  Breast Exam:    No tenderness, masses, or nipple abnormality  Abdomen:     Soft, non-tender, bowel sounds active all four quadrants, no masses, no organomegaly.    Genitalia:    Pelvic:external genitalia normal, vagina without lesions, discharge, or tenderness, rectovaginal septum  normal. Cervix normal in appearance, no cervical motion tenderness, no adnexal masses or tenderness.  Uterus normal size, shape, mobile, regular contours, nontender.  Rectal:    Normal external sphincter.  No hemorrhoids appreciated. Internal exam not done.   Extremities:   Extremities normal, atraumatic, no cyanosis or edema  Pulses:   2+ and symmetric all extremities  Skin:   Skin color, texture, turgor normal, no rashes or lesions  Lymph nodes:   Cervical, supraclavicular, and axillary nodes normal   Neurologic:   CNII-XII intact, normal strength, sensation and reflexes throughout       12/14/2022    2:17 PM 12/14/2022    1:22 PM 11/11/2021   11:19 AM 02/25/2021    9:00 AM 01/14/2021    9:49 AM  Depression screen PHQ 2/9  Decreased Interest 1 0 0 1 1  Down, Depressed, Hopeless 1 0 1 3 3   PHQ - 2 Score 2 0 1 4 4   Altered sleeping 3  3 3 3   Tired, decreased energy 3  1 3 3   Change in appetite 3  1 3  0  Feeling bad or failure about yourself  0  1 1 0  Trouble concentrating 0  1 1 1   Moving slowly or fidgety/restless 0  1 0 0  Suicidal thoughts 0  0 0 0  PHQ-9 Score 11  9 15 11   Difficult doing work/chores Somewhat difficult  Somewhat difficult Somewhat difficult Somewhat difficult       12/14/2022    2:17 PM 02/25/2021    9:00 AM 01/14/2021    9:58 AM  GAD 7 : Generalized Anxiety Score  Nervous, Anxious, on Edge 1 3 3   Control/stop worrying 1 3 3   Worry too much - different things 1 3 3   Trouble relaxing 1 3 3   Restless 3 2 3   Easily annoyed or irritable 2 3 3   Afraid - awful might happen 1 0 0  Total GAD 7 Score 10 17 18   Anxiety Difficulty Somewhat difficult Somewhat difficult Somewhat difficult      Labs:  Lab Results  Component Value Date   WBC 10.6 (H) 03/23/2021   HGB 13.4 03/23/2021   HCT 39.7 03/23/2021   MCV 82.7 03/23/2021   PLT 191 03/23/2021    Lab Results  Component Value Date   CREATININE 0.67 02/02/2016   BUN 8 02/02/2016   NA 134 (L) 02/02/2016   K 3.7 02/02/2016   CL 102 02/02/2016   CO2 24 02/02/2016    Lab Results  Component Value Date   ALT 19 04/01/2015   AST 21 04/01/2015   ALKPHOS 45 04/01/2015   BILITOT 0.4 04/01/2015    Lab  Results  Component Value Date   TSH 1.350 03/20/2019    Assessment:   1. Encounter for well woman exam with routine gynecological exam   2. Screening for diabetes mellitus (DM)   3. Screening cholesterol level   4. Depo-Provera contraceptive status   5. Major depressive disorder, single episode,  moderate (HCC)   6. Generalized anxiety disorder   7. Sleep disturbances      Plan:  - Blood tests: see orders. - Breast self exam technique reviewed and patient encouraged to perform self-exam monthly. - Contraception: Depo-Provera injections. - Discussed healthy lifestyle modifications. - Mammogram  : Not age appropriate - Pap smear  UTD . - Discussed persistent sleep disturbances, advised on increasing her Atarax dosing to 50 mg and holding her Klonopin since it was not very helpful.  Will prescribe Lunesta. Discussed risks vs benefits of initiating a prescription sleep aid, also discussed potential side effects such as vivid/lucid dreams, sleep walking, excessive daytime drowsiness. Advised that medication was considered a controlled substance. Patient ok to still try medication.  - Baseline PHQ-9 and GAD-7 performed today. Mildly elevated. Patient unsure if her lack of sleep is affecting her mood or if she needs to consider increasing her medication vs trying something different. Will attempt to manage sleep issues first as this can have an effect on mood symptoms.  Follow up in 1 year for annual exam. To f/u in 1 month to reassess sleep symptoms   Hildred Laser, MD [facility]

## 2022-12-14 NOTE — Patient Instructions (Addendum)
Preventive Care 22-35 Years Old, Female Preventive care refers to lifestyle choices and visits with your health care provider that can promote health and wellness. Preventive care visits are also called wellness exams. What can I expect for my preventive care visit? Counseling During your preventive care visit, your health care provider may ask about your: Medical history, including: Past medical problems. Family medical history. Pregnancy history. Current health, including: Menstrual cycle. Method of birth control. Emotional well-being. Home life and relationship well-being. Sexual activity and sexual health. Lifestyle, including: Alcohol, nicotine or tobacco, and drug use. Access to firearms. Diet, exercise, and sleep habits. Work and work Astronomer. Sunscreen use. Safety issues such as seatbelt and bike helmet use. Physical exam Your health care provider may check your: Height and weight. These may be used to calculate your BMI (body mass index). BMI is a measurement that tells if you are at a healthy weight. Waist circumference. This measures the distance around your waistline. This measurement also tells if you are at a healthy weight and may help predict your risk of certain diseases, such as type 2 diabetes and high blood pressure. Heart rate and blood pressure. Body temperature. Skin for abnormal spots. What immunizations do I need?  Vaccines are usually given at various ages, according to a schedule. Your health care provider will recommend vaccines for you based on your age, medical history, and lifestyle or other factors, such as travel or where you work. What tests do I need? Screening Your health care provider may recommend screening tests for certain conditions. This may include: Pelvic exam and Pap test. Lipid and cholesterol levels. Diabetes screening. This is done by checking your blood sugar (glucose) after you have not eaten for a while (fasting). Hepatitis  B test. Hepatitis C test. HIV (human immunodeficiency virus) test. STI (sexually transmitted infection) testing, if you are at risk. BRCA-related cancer screening. This may be done if you have a family history of breast, ovarian, tubal, or peritoneal cancers. Talk with your health care provider about your test results, treatment options, and if necessary, the need for more tests. Follow these instructions at home: Eating and drinking  Eat a healthy diet that includes fresh fruits and vegetables, whole grains, lean protein, and low-fat dairy products. Take vitamin and mineral supplements as recommended by your health care provider. Do not drink alcohol if: Your health care provider tells you not to drink. You are pregnant, may be pregnant, or are planning to become pregnant. If you drink alcohol: Limit how much you have to 0-1 drink a day. Know how much alcohol is in your drink. In the [place] one drink equals one 12 oz bottle of beer (355 mL), one 5 oz glass of wine (148 mL), or one 1 oz glass of hard liquor (44 mL). Lifestyle Brush your teeth every morning and night with fluoride toothpaste. Floss one time each day. Exercise for at least 30 minutes 5 or more days each week. Do not use any products that contain nicotine or tobacco. These products include cigarettes, chewing tobacco, and vaping devices, such as e-cigarettes. If you need help quitting, ask your health care provider. Do not use drugs. If you are sexually active, practice safe sex. Use a condom or other form of protection to prevent STIs. If you do not wish to become pregnant, use a form of birth control. If you plan to become pregnant, see your health care provider for a prepregnancy visit. Find healthy ways to manage stress, such as: Meditation,  yoga, or listening to music. Journaling. Talking to a trusted person. Spending time with friends and family. Minimize exposure to UV radiation to reduce your risk of skin  cancer. Safety Always wear your seat belt while driving or riding in a vehicle. Do not drive: If you have been drinking alcohol. Do not ride with someone who has been drinking. If you have been using any mind-altering substances or drugs. While texting. When you are tired or distracted. Wear a helmet and other protective equipment during sports activities. If you have firearms in your house, make sure you follow all gun safety procedures. Seek help if you have been physically or sexually abused. What's next? Go to your health care provider once a year for an annual wellness visit. Ask your health care provider how often you should have your eyes and teeth checked. Stay up to date on all vaccines. This information is not intended to replace advice given to you by your health care provider. Make sure you discuss any questions you have with your health care provider. Document Revised: 09/24/2020 Document Reviewed: 09/24/2020 Elsevier Patient Education  2024 [employer]     Breast Self-Awareness Breast self-awareness means being familiar with how your breasts look and feel. It involves checking your breasts regularly and telling your health care provider about any changes. Practicing breast self-awareness helps to maintain breast health. Sometimes, changes are not harmful (are benign). Other times, a change in your breasts can be a sign of a serious medical problem. Being familiar with the look and feel of your breasts can help you catch a breast problem while it is still small and can be treated. You should do breast self-exams even if you have breast implants. What you need: A mirror. A well-lit room. A pillow or other soft object. How to do a breast self-exam A breast self-exam is one way to learn what is normal for your breasts and whether your breasts are changing. To do a breast self-exam: Look for changes  Remove all the clothing above your waist. Stand in front of a mirror in  a room with good lighting. Put your hands down at your sides. Compare your breasts in the mirror. Look for differences between them (asymmetry), such as: Differences in shape. Differences in size. Puckers, dips, and bumps in one breast and not the other. Look at each breast for changes in the skin, such as: Redness. Scaly areas. Skin thickening. Dimpling. Open sores (ulcers). Look for changes in your nipples, such as: Discharge. Bleeding. Dimpling. Redness. A nipple that looks pushed in (retracted), or that has changed position. Feel for changes Carefully feel your breasts for lumps and changes. It is best to do this self-exam while lying down. Follow these steps to feel each breast: Place a pillow under the shoulder of one side of your body. Place the arm of that side of your body behind your head. Feel the breast of that side of your body using the hand of the opposite arm. To do this: Start in the nipple area and use the pads of your three middle fingers to make -inch (2 cm) overlapping circles. Use light, medium, and then firm pressure as you feel your breast, gently covering the entire breast area and armpit. Continue the overlapping circles, moving downward over the breast until you feel your ribs below your breast. Then, make circles with your fingers going upward until you reach your collarbone. Next, make circles by moving outward across your breast and into your  armpit area. Squeeze the nipple. Check for discharge and lumps. Repeat steps 1-7 to check your other breast. Sit or stand in the tub or shower. With soapy water on your skin, feel each breast the same way you did when you were lying down. Write down what you find Writing down what you find can help you remember what to discuss with your health care provider. Write down: What is normal for each breast. Any changes that you find in each breast. These include: The kind of changes you find. Any pain or  tenderness. Size and location of any lumps. Where you are in your menstrual cycle, if you are still getting your menstrual period (menstruating). General tips If you are breastfeeding, the best time to examine your breasts is after a feeding or after using a breast pump. If you menstruate, the best time to examine your breasts is 5-7 days after your menstrual period. Breasts are generally lumpier during menstrual periods, and it may be more difficult to notice changes. With time and practice, you will become more familiar with the differences in your breasts and more comfortable with the exam. Contact a health care provider if: You see a change in the shape or size of your breasts or nipples. You see a change in the skin of your breast or nipples, such as a reddened or scaly area. You have unusual discharge from your nipples. You find a new lump or thick area. You have breast pain. You have any concerns about your breast health. Summary Breast self-awareness includes looking for physical changes in your breasts and feeling for any changes within your breasts. Breast self-awareness should be done in front of a mirror in a well-lit room. If you menstruate, the best time to examine your breasts is 5-7 days after your menstrual period. Tell your health care provider about any changes you notice in your breasts. Changes include changes in size, changes on the skin, pain or tenderness, or unusual fluid from your nipples. This information is not intended to replace advice given to you by your health care provider. Make sure you discuss any questions you have with your health care provider. Document Revised: 09/03/2021 Document Reviewed: 01/29/2021 Elsevier Patient Education  2024 [employer].

## 2022-12-15 LAB — COMPREHENSIVE METABOLIC PANEL
ALT: 27 IU/L (ref 0–32)
AST: 26 IU/L (ref 0–40)
Albumin: 4.7 g/dL (ref 3.9–4.9)
Alkaline Phosphatase: 50 IU/L (ref 44–121)
BUN/Creatinine Ratio: 16 (ref 9–23)
BUN: 13 mg/dL (ref 6–20)
Bilirubin Total: 0.2 mg/dL (ref 0.0–1.2)
CO2: 20 mmol/L (ref 20–29)
Calcium: 9.9 mg/dL (ref 8.7–10.2)
Chloride: 102 mmol/L (ref 96–106)
Creatinine, Ser: 0.83 mg/dL (ref 0.57–1.00)
Globulin, Total: 2.5 g/dL (ref 1.5–4.5)
Glucose: 117 mg/dL — ABNORMAL HIGH (ref 70–99)
Potassium: 4.1 mmol/L (ref 3.5–5.2)
Sodium: 139 mmol/L (ref 134–144)
Total Protein: 7.2 g/dL (ref 6.0–8.5)
eGFR: 94 mL/min/{1.73_m2} (ref >59)

## 2022-12-15 LAB — CBC
Hematocrit: 36.8 % (ref 34.0–46.6)
Hemoglobin: 12 g/dL (ref 11.1–15.9)
MCH: 27.1 pg (ref 26.6–33.0)
MCHC: 32.6 g/dL (ref 31.5–35.7)
MCV: 83 fL (ref 79–97)
Platelets: 239 10*3/uL (ref 150–450)
RBC: 4.42 x10E6/uL (ref 3.77–5.28)
RDW: 13.9 % (ref 11.7–15.4)
WBC: 6.5 10*3/uL (ref 3.4–10.8)

## 2022-12-15 LAB — LIPID PANEL
Chol/HDL Ratio: 3.1 ratio (ref 0.0–4.4)
Cholesterol, Total: 138 mg/dL (ref 100–199)
HDL: 45 mg/dL (ref >39)
LDL Chol Calc (NIH): 67 mg/dL (ref 0–99)
Triglycerides: 153 mg/dL — ABNORMAL HIGH (ref 0–149)
VLDL Cholesterol Cal: 26 mg/dL (ref 5–40)

## 2022-12-15 LAB — HEMOGLOBIN A1C
Est. average glucose Bld gHb Est-mCnc: 108 mg/dL
Hgb A1c MFr Bld: 5.4 % (ref 4.8–5.6)

## 2022-12-15 LAB — TSH: TSH: 1.08 u[IU]/mL (ref 0.450–4.500)

## 2023-01-25 ENCOUNTER — Ambulatory Visit: Payer: Medicaid Other | Admitting: Obstetrics and Gynecology

## 2023-01-25 ENCOUNTER — Encounter: Payer: Self-pay | Admitting: Obstetrics and Gynecology

## 2023-01-25 VITALS — Ht 71.0 in | Wt 190.0 lb

## 2023-01-25 DIAGNOSIS — F331 Major depressive disorder, recurrent, moderate: Secondary | ICD-10-CM

## 2023-01-25 DIAGNOSIS — G4700 Insomnia, unspecified: Secondary | ICD-10-CM | POA: Diagnosis not present

## 2023-01-25 MED ORDER — VENLAFAXINE HCL ER 75 MG PO CP24: 75.0000 mg | ORAL_CAPSULE | Freq: Every day | ORAL | 3 refills | Status: DC

## 2023-01-25 NOTE — Progress Notes (Signed)
Virtual Visit via Telephone Note  I connected with Angela Fox on 01/25/23 at  4:15 PM EDT by telephone and verified that I am speaking with the correct person using two identifiers.  Location: Patient: Home Provider: Office   I discussed the limitations, risks, security and privacy concerns of performing an evaluation and management service by telephone and the availability of in person appointments. I also discussed with the patient that there may be a patient responsible charge related to this service. The patient expressed understanding and agreed to proceed.   History of Present Illness: Angela Fox is a [id number] who presents for 1 month follow up of her anxiety, depression, and insomnia.  Patient was advised to increase her Zoloft from 100 mg to 150 mg last visit, however notes that she did not like the way that it made her feel, so cut back. States that the past 2 weeks have been really rough for her. States that if it wasn't for taking care of her kids, she doesn't know what would happen. Denies SI/HI at this time.  She was also started on Lunesta for her issues with insomnia (difficulties staying asleep).  Notes that the Lunesta does help her fall asleep faster, but still has issues waking in the middle of the night, sometimes several times during the night.  Also takes hydroxyzine for her anxiety which also can help with sleep, but does not help maintain sleep.    Observations/Objective: Height 5\' 11"  (1.803 m), weight 190 lb (86.2 kg), not currently breastfeeding. Gen App: no acute distress Psych: depressed mood, normal speech and thought content      01/25/2023    4:31 PM 12/14/2022    2:17 PM 12/14/2022    1:22 PM 11/11/2021   11:19 AM 02/25/2021    9:00 AM  Depression screen PHQ 2/9  Decreased Interest 3 1 0 0 1  Down, Depressed, Hopeless 3 1 0 1 3  PHQ - 2 Score 6 2 0 1 4  Altered sleeping 3 3  3 3   Tired, decreased energy 3 3  1 3   Change in appetite 0 3  1 3    Feeling bad or failure about yourself  3 0  1 1  Trouble concentrating 2 0  1 1  Moving slowly or fidgety/restless 2 0  1 0  Suicidal thoughts 1 0  0 0  PHQ-9 Score 20 11  9 15   Difficult doing work/chores Somewhat difficult Somewhat difficult  Somewhat difficult Somewhat difficult      01/25/2023    4:34 PM 12/14/2022    2:17 PM 02/25/2021    9:00 AM 01/14/2021    9:58 AM  GAD 7 : Generalized Anxiety Score  Nervous, Anxious, on Edge 3 1 3 3   Control/stop worrying 3 1 3 3   Worry too much - different things 3 1 3 3   Trouble relaxing 3 1 3 3   Restless 3 3 2 3   Easily annoyed or irritable 0 2 3 3   Afraid - awful might happen 1 1 0 0  Total GAD 7 Score 16 10 17 18   Anxiety Difficulty Somewhat difficult Somewhat difficult Somewhat difficult Somewhat difficult     Assessment and Plan: 1. Major depressive disorder, recurrent episode, moderate with anxious distress (HCC) - Discussed other management options for symptoms, including switching to a different SSRI or to an SNRI, adding a second treatment medication. Also encourage that at this time it may be time to consider  referral to Psychiatry. Also discussed resources such as [facility], mental health crisis hotlines, Emergency Room. Patient ok to try a different medication. Will prescribe Effexor, as this medication has also been known to have drowsiness as a side effect. After several weeks, can begin weaning from Zoloft.  - Ambulatory referral to Psychiatry placed.   2. Insomnia  - Can still utilize Lunesta prn, hydroxyzine for anxiety and sleep.     Follow Up Instructions: Follow up in 2-3 weeks to reassess symptoms.    I discussed the assessment and treatment plan with the patient. The patient was provided an opportunity to ask questions and all were answered. The patient agreed with the plan and demonstrated an understanding of the instructions.   The patient was advised to call back or seek an in-person evaluation if the symptoms  worsen or if the condition fails to improve as anticipated.  I provided 12 minutes of non-face-to-face time during this encounter.   Hildred Laser, MD [facility]

## 2023-01-25 NOTE — Progress Notes (Signed)
   Patient is a 37 y.o. [phone] female who presents for follow up on mood and insomnia. She continues to take her Zoloft, she stopped taking 150 mg, because she didn't like the way it made her feel. She has had stressful few weeks. She is still taking the [place]. She reports that she falls asleep quicker, but wakes up throughout the night and she feels tired when she wakes up in the morning.    Tommie Raymond, CMA [facility]

## 2023-01-26 ENCOUNTER — Telehealth: Payer: Self-pay | Admitting: Obstetrics and Gynecology

## 2023-01-26 NOTE — Telephone Encounter (Signed)
Patient needs an appointment in 3 weeks with Dr.Cherry to follow up mood/depression from today 01/26/23. I contacted the patient via phone. Left message for the patient to call back scheduling.

## 2023-01-27 NOTE — Telephone Encounter (Signed)
The patient is scheduled for 10/31 with Dr. Valentino Saxon at 1:35 pm.

## 2023-02-10 ENCOUNTER — Ambulatory Visit: Payer: Medicaid Other | Admitting: Obstetrics and Gynecology

## 2023-03-01 ENCOUNTER — Ambulatory Visit: Payer: Medicaid Other

## 2023-03-01 NOTE — Progress Notes (Deleted)
Error

## 2023-03-09 MED ORDER — HYDROXYZINE HCL 50 MG PO TABS: 50.0000 mg | ORAL_TABLET | Freq: Three times a day (TID) | ORAL | 3 refills | Status: DC | PRN

## 2023-03-09 NOTE — Addendum Note (Signed)
Addended by: Fabian November on: 03/09/2023 03:22 PM   Modules accepted: Orders

## 2023-04-04 ENCOUNTER — Other Ambulatory Visit: Payer: Self-pay | Admitting: Obstetrics and Gynecology

## 2023-05-07 ENCOUNTER — Ambulatory Visit (HOSPITAL_BASED_OUTPATIENT_CLINIC_OR_DEPARTMENT_OTHER): Payer: Medicaid Other | Admitting: Psychiatry

## 2023-05-07 ENCOUNTER — Encounter (HOSPITAL_COMMUNITY): Payer: Self-pay

## 2023-05-07 DIAGNOSIS — Z91199 Patient's noncompliance with other medical treatment and regimen due to unspecified reason: Secondary | ICD-10-CM | POA: Insufficient documentation

## 2023-05-07 NOTE — Progress Notes (Signed)
No response to call or text or video invite

## 2023-06-15 ENCOUNTER — Ambulatory Visit: Payer: Self-pay | Admitting: Psychiatry

## 2023-11-02 ENCOUNTER — Telehealth: Payer: Self-pay

## 2023-11-02 NOTE — Telephone Encounter (Signed)
 Pls call pt to schedule annual.  Received Rx RF request from pharmacy sertraline  (ZOLOFT ) 100 MG tablet . I called pt to let her know she will soon be due for an annual and asked her if she needed a RF? States she does not need a RF at this time. Aware she will be due for annual soon.

## 2023-11-07 NOTE — Telephone Encounter (Signed)
 Contacted the patient via phone. Left message for the patient to contact our office for scheduling.

## 2023-12-19 ENCOUNTER — Telehealth: Admitting: Physician Assistant

## 2023-12-19 DIAGNOSIS — N760 Acute vaginitis: Secondary | ICD-10-CM | POA: Diagnosis not present

## 2023-12-19 DIAGNOSIS — B9689 Other specified bacterial agents as the cause of diseases classified elsewhere: Secondary | ICD-10-CM | POA: Diagnosis not present

## 2023-12-19 MED ORDER — METRONIDAZOLE 500 MG PO TABS
500.0000 mg | ORAL_TABLET | Freq: Two times a day (BID) | ORAL | 0 refills | Status: AC
Start: 2023-12-19 — End: 2023-12-26

## 2023-12-19 NOTE — Progress Notes (Signed)
 E-Visit for Vaginal Symptoms  We are sorry that you are not feeling well. Here is how we plan to help! Based on what you shared with me it looks like you: May have a vaginosis due to bacteria  Vaginosis is an inflammation of the vagina that can result in discharge, itching and pain. The cause is usually a change in the normal balance of vaginal bacteria or an infection. Vaginosis can also result from reduced estrogen levels after menopause.  The most common causes of vaginosis are:   Bacterial vaginosis which results from an overgrowth of one on several organisms that are normally present in your vagina.   Yeast infections which are caused by a naturally occurring fungus called candida.   Vaginal atrophy (atrophic vaginosis) which results from the thinning of the vagina from reduced estrogen levels after menopause.   Trichomoniasis which is caused by a parasite and is commonly transmitted by sexual intercourse.  Factors that increase your risk of developing vaginosis include: Medications, such as antibiotics and steroids Uncontrolled diabetes Use of hygiene products such as bubble bath, vaginal spray or vaginal deodorant Douching Wearing damp or tight-fitting clothing Using an intrauterine device (IUD) for birth control Hormonal changes, such as those associated with pregnancy, birth control pills or menopause Sexual activity Having a sexually transmitted infection  Your treatment plan is Metronidazole or Flagyl 500mg  twice a day for 7 days.  I have electronically sent this prescription into the pharmacy that you have chosen.  Be sure to take all of the medication as directed. Stop taking any medication if you develop a rash, tongue swelling or shortness of breath. Mothers who are breast feeding should consider pumping and discarding their breast milk while on these antibiotics. However, there is no consensus that infant exposure at these doses would be harmful.  Remember that  medication creams can weaken latex condoms. SABRA   HOME CARE:  Good hygiene may prevent some types of vaginosis from recurring and may relieve some symptoms:  Avoid baths, hot tubs and whirlpool spas. Rinse soap from your outer genital area after a shower, and dry the area well to prevent irritation. Don't use scented or harsh soaps, such as those with deodorant or antibacterial action. Avoid irritants. These include scented tampons and pads. Wipe from front to back after using the toilet. Doing so avoids spreading fecal bacteria to your vagina.  Other things that may help prevent vaginosis include:  Don't douche. Your vagina doesn't require cleansing other than normal bathing. Repetitive douching disrupts the normal organisms that reside in the vagina and can actually increase your risk of vaginal infection. Douching won't clear up a vaginal infection. Use a latex condom. Both female and female latex condoms may help you avoid infections spread by sexual contact. Wear cotton underwear. Also wear pantyhose with a cotton crotch. If you feel comfortable without it, skip wearing underwear to bed. Yeast thrives in [employer] Your symptoms should improve in the next day or two.  GET HELP RIGHT AWAY IF:  You have pain in your lower abdomen ( pelvic area or over your ovaries) You develop nausea or vomiting You develop a fever Your discharge changes or worsens You have persistent pain with intercourse You develop shortness of breath, a rapid pulse, or you faint.  These symptoms could be signs of problems or infections that need to be evaluated by a medical provider now.  MAKE SURE YOU   Understand these instructions. Will watch your condition. Will get help right  away if you are not doing well or get worse.  Thank you for choosing an e-visit.  Your e-visit answers were reviewed by a board certified advanced clinical practitioner to complete your personal care plan. Depending upon the  condition, your plan could have included both over the counter or prescription medications.  Please review your pharmacy choice. Make sure the pharmacy is open so you can pick up prescription now. If there is a problem, you may contact your provider through [employer] and have the prescription routed to another pharmacy.  Your safety is important to us . If you have drug allergies check your prescription carefully.   For the next 24 hours you can use MyChart to ask questions about today's visit, request a non-urgent call back, or ask for a work or school excuse. You will get an email in the next two days asking about your experience. I hope that your e-visit has been valuable and will speed your recovery.  I have spent 5 minutes in review of e-visit questionnaire, review and updating patient chart, medical decision making and response to patient.   Delon CHRISTELLA Dickinson, PA-C

## 2023-12-20 ENCOUNTER — Other Ambulatory Visit: Payer: Self-pay

## 2023-12-20 MED ORDER — HYDROXYZINE HCL 50 MG PO TABS: 50.0000 mg | ORAL_TABLET | Freq: Three times a day (TID) | ORAL | 0 refills | Status: AC | PRN

## 2023-12-20 MED ORDER — SERTRALINE HCL 100 MG PO TABS: 150.0000 mg | ORAL_TABLET | Freq: Every day | ORAL | 0 refills | Status: DC

## 2023-12-20 MED ORDER — ESZOPICLONE 1 MG PO TABS: 1.0000 mg | ORAL_TABLET | Freq: Every evening | ORAL | 0 refills | Status: AC | PRN

## 2023-12-20 MED ORDER — ACYCLOVIR 400 MG PO TABS: 400.0000 mg | ORAL_TABLET | Freq: Two times a day (BID) | ORAL | 0 refills | Status: DC

## 2023-12-20 NOTE — Telephone Encounter (Signed)
 30 day refills sent in until annual exam on 01/20/24.

## 2024-01-14 ENCOUNTER — Other Ambulatory Visit: Payer: Self-pay | Admitting: Certified Nurse Midwife

## 2024-01-19 NOTE — Progress Notes (Unsigned)
 ANNUAL EXAM Patient name: Angela Fox MRN [id number]  Date of birth: [birth year] Chief Complaint:   No chief complaint on file.  History of Present Illness:   Angela Fox is a 38 y.o. [phone] {race:25618} female being seen today for a routine annual exam.  Current complaints: ***  No LMP recorded.       The pregnancy intention screening data noted above was reviewed. Potential methods of contraception were discussed. The patient elected to proceed with No data recorded.      Component Value Date/Time   DIAGPAP  09/18/2020 1149    - Negative for intraepithelial lesion or malignancy (NILM)   HPVHIGH Negative 09/18/2020 1149   ADEQPAP  09/18/2020 1149    Satisfactory for evaluation; transformation zone component PRESENT.      Last pap ***. Results were: {Pap findings:25134}. H/O abnormal pap: {yes/yes***/no:23866} Last mammogram: ***. Results were: {normal, abnormal, n/a:23837}. Family h/o breast cancer: {yes***/no:23838} Last colonoscopy: ***. Results were: {normal, abnormal, n/a:23837}. Family h/o colorectal cancer: {yes***/no:23838}     01/25/2023    4:31 PM 12/14/2022    2:17 PM 12/14/2022    1:22 PM 11/11/2021   11:19 AM 02/25/2021    9:00 AM  Depression screen PHQ 2/9  Decreased Interest 3 1 0 0 1  Down, Depressed, Hopeless 3 1 0 1 3  PHQ - 2 Score 6 2 0 1 4  Altered sleeping 3 3  3 3   Tired, decreased energy 3 3  1 3   Change in appetite 0 3  1 3   Feeling bad or failure about yourself  3 0  1 1  Trouble concentrating 2 0  1 1  Moving slowly or fidgety/restless 2 0  1 0  Suicidal thoughts 1 0  0 0  PHQ-9 Score 20 11  9 15   Difficult doing work/chores Somewhat difficult Somewhat difficult  Somewhat difficult Somewhat difficult        01/25/2023    4:34 PM 12/14/2022    2:17 PM 02/25/2021    9:00 AM 01/14/2021    9:58 AM  GAD 7 : Generalized Anxiety Score  Nervous, Anxious, on Edge 3 1 3 3   Control/stop worrying 3 1 3 3   Worry too much - different things 3 1  3 3   Trouble relaxing 3 1 3 3   Restless 3 3 2 3   Easily annoyed or irritable 0 2 3 3   Afraid - awful might happen 1 1 0 0  Total GAD 7 Score 16 10 17 18   Anxiety Difficulty Somewhat difficult Somewhat difficult Somewhat difficult Somewhat difficult      Past Medical History:  Diagnosis Date   Abnormal Pap smear of cervix    Amenorrhea    Cervical dysplasia    Required LEEP   Endometriosis    Generalized anxiety disorder 02/27/2018   Gonorrhea 09/25/2020   Herpes genitalis    Major depressive disorder, single episode, moderate (HCC) 02/27/2018    Family History  Problem Relation Age of Onset   Cancer Mother    Migraines Mother    Seizures Mother    Stroke Mother    Rheum arthritis Maternal Grandmother    Rheum arthritis Maternal Grandfather    Migraines Brother    Review of Systems:   Pertinent items are noted in HPI Denies any headaches, blurred vision, fatigue, shortness of breath, chest pain, abdominal pain, abnormal vaginal discharge/itching/odor/irritation, problems with periods, bowel movements, urination, or intercourse unless otherwise stated above. Pertinent History Reviewed:  Reviewed past medical,surgical, social and family history.  Reviewed problem list, medications and allergies. Physical Assessment:  There were no vitals filed for this visit.There is no height or weight on file to calculate BMI.       Physical Exam   No results found for this or any previous visit (from the past 24 hours).  Assessment & Plan:  No problem-specific Assessment & Plan notes found for this encounter.    Mammogram: {Mammo f/u:25212::@ 38yo}, or sooner if problems Colonoscopy: {TCS f/u:25213::@ 38yo}, or sooner if problems  No orders of the defined types were placed in this encounter.   Meds: No orders of the defined types were placed in this encounter.   Follow-up: No follow-ups on file.  Mathis LITTIE Getting, CMA 01/19/2024 10:43 AM

## 2024-01-20 ENCOUNTER — Encounter: Payer: Self-pay | Admitting: Certified Nurse Midwife

## 2024-01-20 ENCOUNTER — Ambulatory Visit: Admitting: Certified Nurse Midwife

## 2024-01-20 VITALS — BP 113/71 | HR 87 | Ht 71.0 in | Wt 186.6 lb

## 2024-01-20 DIAGNOSIS — Z01419 Encounter for gynecological examination (general) (routine) without abnormal findings: Secondary | ICD-10-CM | POA: Diagnosis not present

## 2024-01-20 DIAGNOSIS — F411 Generalized anxiety disorder: Secondary | ICD-10-CM

## 2024-01-20 DIAGNOSIS — F321 Major depressive disorder, single episode, moderate: Secondary | ICD-10-CM

## 2024-01-20 DIAGNOSIS — F331 Major depressive disorder, recurrent, moderate: Secondary | ICD-10-CM

## 2024-01-20 DIAGNOSIS — Z3201 Encounter for pregnancy test, result positive: Secondary | ICD-10-CM

## 2024-01-20 DIAGNOSIS — Z3200 Encounter for pregnancy test, result unknown: Secondary | ICD-10-CM

## 2024-01-20 LAB — POCT URINE PREGNANCY: Preg Test, Ur: POSITIVE — AB

## 2024-01-20 MED ORDER — DROSPIRENONE-ETHINYL ESTRADIOL 3-0.02 MG PO TABS: 1.0000 | ORAL_TABLET | Freq: Every day | ORAL | 4 refills | Status: AC

## 2024-01-20 MED ORDER — VENLAFAXINE HCL ER 37.5 MG PO CP24: 37.5000 mg | ORAL_CAPSULE | Freq: Every day | ORAL | 0 refills | Status: DC

## 2024-01-20 NOTE — Patient Instructions (Signed)
 Preventive Care 36-36 Years Old, Female  Preventive care refers to lifestyle choices and visits with your health care provider that can promote health and wellness. Preventive care visits are also called wellness exams. What can I expect for my preventive care visit? Counseling During your preventive care visit, your health care provider may ask about your: Medical history, including: Past medical problems. Family medical history. Pregnancy history. Current health, including: Menstrual cycle. Method of birth control. Emotional well-being. Home life and relationship well-being. Sexual activity and sexual health. Lifestyle, including: Alcohol, nicotine or tobacco, and drug use. Access to firearms. Diet, exercise, and sleep habits. Work and work Astronomer. Sunscreen use. Safety issues such as seatbelt and bike helmet use. Physical exam Your health care provider may check your: Height and weight. These may be used to calculate your BMI (body mass index). BMI is a measurement that tells if you are at a healthy weight. Waist circumference. This measures the distance around your waistline. This measurement also tells if you are at a healthy weight and may help predict your risk of certain diseases, such as type 2 diabetes and high blood pressure. Heart rate and blood pressure. Body temperature. Skin for abnormal spots. What immunizations do I need?  Vaccines are usually given at various ages, according to a schedule. Your health care provider will recommend vaccines for you based on your age, medical history, and lifestyle or other factors, such as travel or where you work. What tests do I need? Screening Your health care provider may recommend screening tests for certain conditions. This may include: Pelvic exam and Pap test. Lipid and cholesterol levels. Diabetes screening. This is done by checking your blood sugar (glucose) after you have not eaten for a while  (fasting). Hepatitis B test. Hepatitis C test. HIV (human immunodeficiency virus) test. STI (sexually transmitted infection) testing, if you are at risk. BRCA-related cancer screening. This may be done if you have a family history of breast, ovarian, tubal, or peritoneal cancers. Talk with your health care provider about your test results, treatment options, and if necessary, the need for more tests. Follow these instructions at home: Eating and drinking  Eat a healthy diet that includes fresh fruits and vegetables, whole grains, lean protein, and low-fat dairy products. Take vitamin and mineral supplements as recommended by your health care provider. Do not drink alcohol if: Your health care provider tells you not to drink. You are pregnant, may be pregnant, or are planning to become pregnant. If you drink alcohol: Limit how much you have to 0-1 drink a day. Know how much alcohol is in your drink. In the [place] one drink equals one 12 oz bottle of beer (355 mL), one 5 oz glass of wine (148 mL), or one 1 oz glass of hard liquor (44 mL). Lifestyle Brush your teeth every morning and night with fluoride toothpaste. Floss one time each day. Exercise for at least 30 minutes 5 or more days each week. Do not use any products that contain nicotine or tobacco. These products include cigarettes, chewing tobacco, and vaping devices, such as e-cigarettes. If you need help quitting, ask your health care provider. Do not use drugs. If you are sexually active, practice safe sex. Use a condom or other form of protection to prevent STIs. If you do not wish to become pregnant, use a form of birth control. If you plan to become pregnant, see your health care provider for a prepregnancy visit. Find healthy ways to manage stress, such as:  Meditation, yoga, or listening to music. Journaling. Talking to a trusted person. Spending time with friends and family. Minimize exposure to UV radiation to reduce your  risk of skin cancer. Safety Always wear your seat belt while driving or riding in a vehicle. Do not drive: If you have been drinking alcohol. Do not ride with someone who has been drinking. If you have been using any mind-altering substances or drugs. While texting. When you are tired or distracted. Wear a helmet and other protective equipment during sports activities. If you have firearms in your house, make sure you follow all gun safety procedures. Seek help if you have been physically or sexually abused. What's next? Go to your health care provider once a year for an annual wellness visit. Ask your health care provider how often you should have your eyes and teeth checked. Stay up to date on all vaccines. This information is not intended to replace advice given to you by your health care provider. Make sure you discuss any questions you have with your health care provider. Document Revised: 09/24/2020 Document Reviewed: 09/24/2020 Elsevier Patient Education  2024 Elsevier Inc.     How to Do a Breast Self-Exam Doing breast self-exams can help you stay healthy. They're one way to know what's normal for your breasts. They can help you catch a problem while it's still small and can be treated. You need to: Check your breasts often. Tell your doctor about any changes. You should do breast self-exams even if you have breast implants. What you need: A mirror. A well-lit room. A pillow or other soft object. How to do a breast self-exam Look for changes  Take off all the clothes above your waist. Stand in front of a mirror in a room with good lighting. Put your hands down at your sides. Compare your breasts in the mirror. Look for difference between them, such as: Differences in shape. Differences in size. Wrinkles, dips, and bumps in one breast and not the other. Look at each breast for skin changes, such as: Redness. Scaly spots. Spots where your skin is  thicker. Dimpling. Open sores. Look for changes in your nipples, such as: Fluid coming out of a nipple. Fluid around a nipple. Bleeding. Dimpling. Redness. A nipple that looks pushed in or that has changed position. Feel for changes Lie on your back. Feel each breast. To do this: Pick a breast to feel. Place a pillow under the shoulder closest to that breast. Put the arm closest to that breast behind your head. Feel the breast using the hand of your other arm. Use the pads of your three middle fingers to make small circles starting near the nipple. Use light, medium, and firm pressure. Keep making circles, moving down over the breast. Stop when you feel your ribs. Start making circles with your fingers again, this time going up until you reach your collarbone. Then, make circles out across your breast and into your armpit area. Squeeze your nipple. Check for fluid and lumps. Do these steps again to check your other breast. Sit or stand in the tub or shower. With soapy water on your skin, feel each breast the same way you did when you were lying down. Write down what you find Writing down what you find can help you keep track of what you want to tell your doctor. Write down: What's normal for each breast. Any changes you find. Write down: The kind of change. If your breast feels tender or painful.  Any lump you find. Write down its size and where it is. When you last had your period. General tips If you're breastfeeding, the best time to check your breasts is after you feed your baby or after you use a breast pump. If you get a period, the best time to check your breasts is 5-7 days after your period ends. With time, you'll get more used to doing the self-exam. You'll also start to know if there are changes in your breasts. Contact a doctor if: You see a change in the shape or size of your breasts or nipples. You see a change in the skin of your breast or nipples. You have fluid  coming from your nipples that isn't normal. You find a new lump or thick area. You have breast pain. You have any concerns about your breast health. This information is not intended to replace advice given to you by your health care provider. Make sure you discuss any questions you have with your health care provider. Document Revised: 06/08/2023 Document Reviewed: 06/08/2023 Elsevier Patient Education  2025 [employer].

## 2024-01-20 NOTE — Assessment & Plan Note (Signed)
 Health promotion/maintenance for age reviewed. Pap up to date, due 2027.

## 2024-01-20 NOTE — Assessment & Plan Note (Signed)
 Decrease & stop Zoloft , reviewed that being on both SNRI & SSRI is not recommended. Will decrease slowly & increase Effexor .

## 2024-01-21 ENCOUNTER — Ambulatory Visit: Payer: Self-pay | Admitting: Certified Nurse Midwife

## 2024-01-21 LAB — BETA HCG QUANT (REF LAB): hCG Quant: 586 m[IU]/mL

## 2024-02-02 ENCOUNTER — Telehealth: Payer: Self-pay

## 2024-02-02 MED ORDER — VENLAFAXINE HCL ER 37.5 MG PO CP24: 37.5000 mg | ORAL_CAPSULE | Freq: Every day | ORAL | 0 refills | Status: DC

## 2024-02-06 ENCOUNTER — Encounter: Payer: Self-pay | Admitting: Certified Nurse Midwife

## 2024-02-06 ENCOUNTER — Other Ambulatory Visit: Payer: Self-pay | Admitting: Certified Nurse Midwife

## 2024-02-06 DIAGNOSIS — F321 Major depressive disorder, single episode, moderate: Secondary | ICD-10-CM

## 2024-02-06 MED ORDER — VENLAFAXINE HCL ER 150 MG PO CP24: 150.0000 mg | ORAL_CAPSULE | Freq: Every day | ORAL | 1 refills | Status: DC

## 2024-02-14 ENCOUNTER — Other Ambulatory Visit: Payer: Self-pay | Admitting: Certified Nurse Midwife

## 2024-03-13 ENCOUNTER — Other Ambulatory Visit: Payer: Self-pay | Admitting: Certified Nurse Midwife
